# Patient Record
Sex: Male | Born: 1935 | Race: Black or African American | Hispanic: No | Marital: Married | Smoking: Former smoker
Health system: Southern US, Community
[De-identification: ages and names within clinical notes are randomized; demographics above are authoritative.]

## PROBLEM LIST (undated history)

## (undated) DIAGNOSIS — N400 Enlarged prostate without lower urinary tract symptoms: Secondary | ICD-10-CM

## (undated) DIAGNOSIS — R1310 Dysphagia, unspecified: Secondary | ICD-10-CM

## (undated) DIAGNOSIS — I10 Essential (primary) hypertension: Secondary | ICD-10-CM

## (undated) DIAGNOSIS — C189 Malignant neoplasm of colon, unspecified: Secondary | ICD-10-CM

## (undated) DIAGNOSIS — I2510 Atherosclerotic heart disease of native coronary artery without angina pectoris: Secondary | ICD-10-CM

## (undated) DIAGNOSIS — N200 Calculus of kidney: Secondary | ICD-10-CM

## (undated) DIAGNOSIS — I639 Cerebral infarction, unspecified: Secondary | ICD-10-CM

## (undated) DIAGNOSIS — J690 Pneumonitis due to inhalation of food and vomit: Secondary | ICD-10-CM

## (undated) HISTORY — PX: APPENDECTOMY: SHX54

## (undated) HISTORY — PX: CATARACT EXTRACTION: SUR2

---

## 2010-08-25 ENCOUNTER — Ambulatory Visit: Payer: Self-pay | Admitting: Internal Medicine

## 2010-12-31 ENCOUNTER — Ambulatory Visit: Payer: Self-pay | Admitting: Family Medicine

## 2013-12-09 ENCOUNTER — Inpatient Hospital Stay: Payer: Self-pay | Admitting: Internal Medicine

## 2013-12-09 LAB — BASIC METABOLIC PANEL
ANION GAP: 8 (ref 7–16)
BUN: 12 mg/dL (ref 7–18)
CO2: 23 mmol/L (ref 21–32)
Calcium, Total: 8.6 mg/dL (ref 8.5–10.1)
Chloride: 107 mmol/L (ref 98–107)
Creatinine: 1.18 mg/dL (ref 0.60–1.30)
EGFR (African American): >60
EGFR (Non-African Amer.): 56 — ABNORMAL LOW
GLUCOSE: 119 mg/dL — AB (ref 65–99)
Osmolality: 277 (ref 275–301)
Potassium: 3.9 mmol/L (ref 3.5–5.1)
Sodium: 138 mmol/L (ref 136–145)

## 2013-12-09 LAB — CBC
HCT: 44.8 % (ref 40.0–52.0)
HGB: 14.3 g/dL (ref 13.0–18.0)
MCH: 30.1 pg (ref 26.0–34.0)
MCHC: 32 g/dL (ref 32.0–36.0)
MCV: 94 fL (ref 80–100)
PLATELETS: 146 10*3/uL — AB (ref 150–440)
RBC: 4.77 10*6/uL (ref 4.40–5.90)
RDW: 15.2 % — ABNORMAL HIGH (ref 11.5–14.5)
WBC: 6.5 10*3/uL (ref 3.8–10.6)

## 2013-12-09 LAB — TROPONIN I: Troponin-I: 0.02 ng/mL

## 2013-12-10 LAB — CBC WITH DIFFERENTIAL/PLATELET
Basophil #: 0 10*3/uL (ref 0.0–0.1)
Basophil %: 0.4 %
EOS ABS: 0.1 10*3/uL (ref 0.0–0.7)
Eosinophil %: 1.3 %
HCT: 47.5 % (ref 40.0–52.0)
HGB: 15.3 g/dL (ref 13.0–18.0)
Lymphocyte #: 0.7 10*3/uL — ABNORMAL LOW (ref 1.0–3.6)
Lymphocyte %: 12.4 %
MCH: 30.4 pg (ref 26.0–34.0)
MCHC: 32.2 g/dL (ref 32.0–36.0)
MCV: 95 fL (ref 80–100)
Monocyte #: 0.5 x10 3/mm (ref 0.2–1.0)
Monocyte %: 8.2 %
NEUTROS PCT: 77.7 %
Neutrophil #: 4.7 10*3/uL (ref 1.4–6.5)
Platelet: 161 10*3/uL (ref 150–440)
RBC: 5.03 10*6/uL (ref 4.40–5.90)
RDW: 15.1 % — ABNORMAL HIGH (ref 11.5–14.5)
WBC: 6 10*3/uL (ref 3.8–10.6)

## 2013-12-10 LAB — BASIC METABOLIC PANEL
Anion Gap: 11 (ref 7–16)
BUN: 9 mg/dL (ref 7–18)
CHLORIDE: 103 mmol/L (ref 98–107)
Calcium, Total: 9.1 mg/dL (ref 8.5–10.1)
Co2: 24 mmol/L (ref 21–32)
Creatinine: 1.19 mg/dL (ref 0.60–1.30)
EGFR (Non-African Amer.): 55 — ABNORMAL LOW
GLUCOSE: 132 mg/dL — AB (ref 65–99)
Osmolality: 276 (ref 275–301)
Potassium: 3.6 mmol/L (ref 3.5–5.1)
Sodium: 138 mmol/L (ref 136–145)

## 2013-12-10 LAB — LIPID PANEL
Cholesterol: 187 mg/dL (ref 0–200)
HDL Cholesterol: 55 mg/dL (ref 40–60)
LDL CHOLESTEROL, CALC: 117 mg/dL — AB (ref 0–100)
Triglycerides: 75 mg/dL (ref 0–200)
VLDL Cholesterol, Calc: 15 mg/dL (ref 5–40)

## 2013-12-11 DIAGNOSIS — I359 Nonrheumatic aortic valve disorder, unspecified: Secondary | ICD-10-CM

## 2013-12-11 LAB — URINALYSIS, COMPLETE
Bilirubin,UR: NEGATIVE
Blood: NEGATIVE
Glucose,UR: NEGATIVE mg/dL (ref 0–75)
KETONE: NEGATIVE
NITRITE: NEGATIVE
Ph: 6 (ref 4.5–8.0)
Protein: NEGATIVE
RBC,UR: <1 /HPF (ref 0–5)
Specific Gravity: 1.01 (ref 1.003–1.030)
WBC UR: 43 /HPF (ref 0–5)

## 2014-01-24 DIAGNOSIS — R040 Epistaxis: Secondary | ICD-10-CM | POA: Insufficient documentation

## 2014-01-24 DIAGNOSIS — IMO0001 Reserved for inherently not codable concepts without codable children: Secondary | ICD-10-CM | POA: Insufficient documentation

## 2014-07-19 ENCOUNTER — Inpatient Hospital Stay: Payer: Self-pay | Admitting: Internal Medicine

## 2014-09-08 NOTE — Discharge Summary (Signed)
PATIENT NAME:  Phillip [name], Phillip [name] MR#:  [id number] DATE OF BIRTH:  20-Apr-1927  DATE OF ADMISSION:  12/09/2013 DATE OF DISCHARGE:  12/12/2013  DISCHARGE DIAGNOSES:  Hypertension due to medication noncompliance.   SECONDARY DIAGNOSES: 1.  Hypertension.  2.  Coronary artery disease. 3.  Cataract.   CONSULTATIONS: None.   PROCEDURES AND RADIOLOGY: CT scan of the head without contrast on July 25, showed no acute problems.   KUB on July 26, showed no acute pathology.   A 2-D echocardiogram on July 27, showed LVEF of 55% to 60%. Impaired relaxation of LV diastolic filling. Moderate concentric LVH.   MAJOR LABORATORY PANEL: Urinalysis on admission was negative.   HISTORY AND SHORT HOSPITAL COURSE: The patient is an 81 year old male with above-mentioned medical problems who was admitted for malignant hypertension. Please see Dr. Lacie Scotts dictated history and physical for further details. The patient was having severe headache, thought to be due to hypertensive urgency/malignant hypertension. He was started on IV antihypertensive nitroglycerin drip and was slowly improving. His blood pressure started getting much better. His symptoms improved by July 28, and was close to his baseline and was discharged home in stable condition.   PHYSICAL EXAMINATION: VITAL SIGNS: On the date of discharge, his vital signs are as follows: Temperature 98.1, heart rate 62 per minute, respirations 20 per minute, blood pressure 109/69 mmHg, was saturating 97% on room air.   CARDIOVASCULAR: S1, S2 normal. No murmurs, rubs or gallop.  LUNGS: Clear to auscultation bilaterally. No wheezing, rales, rhonchi, or crepitation.  ABDOMEN: Soft, benign.  NEUROLOGIC: Nonfocal examination.   All other physical examination remained at baseline.   DISCHARGE MEDICATIONS: 1.  Vitamin D3 1000 international units once daily. 2.  Hydrochlorothiazide  25 mg p.o. daily.  3.  Alfuzosin 10 mg p.o. at bedtime.  4.  Metoprolol 50  mg p.o. b.i.d.  5.  Clonidine 0.2 mg p.o. b.i.d.  6.  Isosorbide mononitrate 30 mg p.o. daily.   DISCHARGE DIET: Low sodium.   DISCHARGE ACTIVITY: As tolerated.   DISCHARGE INSTRUCTIONS AND FOLLOWUP: The patient was instructed to follow up with his primary care physician at [facility] in 1-2 weeks.  Please note, the patient was set up to get home health services by care management.  TOTAL TIME DISCHARGING THIS PATIENT: 45 minutes.    ____________________________ Lucina Mellow. Manuella Ghazi, MD vss:ds D: 12/13/2013 19:02:23 ET T: 12/13/2013 22:35:47 ET JOB#: [id number]  cc: Eathon Valade S. Manuella Ghazi, MD, <Dictator> [place] MD ELECTRONICALLY SIGNED 12/14/2013 19:50

## 2014-09-08 NOTE — H&P (Signed)
PATIENT NAME:  Phillip [name], NEBEL MR#:  [id number] DATE OF BIRTH:  November 24, 1926  DATE OF ADMISSION:  12/09/2013  PRIMARY CARE PHYSICIAN: [facility]   REFERRING PHYSICIAN: Dr. Jacqualine Code  CHIEF COMPLAINT: Headache.  HISTORY OF PRESENT ILLNESS: This 81 year old gentleman with past medical history of uncontrolled hypertension, presents today after two days of headache, and is found to have a blood pressure of greater than 200/100. He denies chest pain, shortness of breath, change in vision, focal numbness or weakness, swelling. He does have an occasional cough. He states that his headache began two days ago, though his wife is present and states that he has been feeling poorly for several days. His wife reports that he has a history of noncompliance with antihypertensive medications. His wife reports that he was in the emergency room at the [place] sometime this winter with a similar complaint of headache and was found to be very hypertensive at that time. He was not admitted at that time.   PAST MEDICAL HISTORY:  1.  Hypertension. 2.  Coronary artery disease, status post myocardial infarction in the 1970s. 3.  Nephrolithiasis. 4.  Cataracts bilaterally. 5.  History of small bowel obstructions, recurrent.   PAST SURGICAL HISTORY:  1.  Appendectomy. 2.  Small bowel obstruction procedure. 3.  Bilateral cataract removal. 4.  Procedure for nephrolithiasis.   ALLERGIES: No known allergies.  HOME MEDICATIONS:  1.  Metoprolol 50 mg daily. 2.  Amlodipine 10 mg daily.   SOCIAL HISTORY: The patient lives at home with his wife. He performs all of his own activities of daily living and is mobile without assistance. He is a former truck Aeronautical engineer. He is a [place] patient. He is not a smoker, does not drink alcohol.  FAMILY HISTORY: Positive for coronary artery disease and hypertension.  REVIEW OF SYSTEMS: CONSTITUTIONAL: Denies fever, weakness. Positive for fatigue. Negative for pain or weight  change. HEENT: Denies blurred or double vision, eye pain, glaucoma, change in hearing (he is very hard of hearing at baseline). Denies difficulty swallowing. Has had a recent dry throat with cough. RESPIRATORY: Denies wheezing, shortness of breath, painful respirations, cough with exudate. He has had a dry cough recently. CARDIOVASCULAR: Denies chest pain, orthopnea, decreased exercise tolerance, edema, palpitations. GASTROINTESTINAL: No nausea, vomiting, diarrhea, abdominal pain, hematochezia. No symptoms of reflux. GENITOURINARY: No dysuria, hematuria, frequency. MUSCULOSKELETAL: Denies pain, swelling, redness, change in range of motion of any joint. NEUROLOGIC: Denies focal numbness, weakness, any change in mental status. No seizures noted. PSYCHIATRIC: Denies anxiety, depression, insomnia.  PHYSICAL EXAMINATION: VITAL SIGNS: Temperature is 97.7, pulse is 84, respirations 16, saturation 100% on room air, blood pressure 197/107. GENERAL: The patient is fatigued, comfortable, oriented, lying in the exam bed. HEENT: Pupils are constricted, equal, reactive. Conjunctivae are clear. There is no icterus. Extraocular motion is intact. No nasal lesions or drainage. Mucous membranes are pink and moist. Poor dentition, with gingivitis and many missing teeth. NECK: Supple and symmetric. No JVD. Neck is nontender, with full range of motion. RESPIRATORY: Good respiratory effort. Lungs are clear to auscultation bilaterally. CARDIOVASCULAR: Regular rate and rhythm. No murmurs, rubs, or gallops. Pedal pulses are 2+. GASTROINTESTINAL: Bowel sounds are normal. There is no abdominal tenderness or distension. No rebound, guarding. MUSCULOSKELETAL: The patient moves all extremities. Range of motion is normal. no tenderness. No effusion. Strength and tone are equal bilaterally. SKIN: There is no rash, no ulceration, no wounds. LYMPH NODES: There is no cervical adenopathy. VASCULAR: Pedal pulses are 2+  bilaterally. NEUROLOGIC: Cranial nerves II through XII are grossly intact. Strength is normal and equal bilaterally. DTRs are symmetric. The patient is alert and oriented x4.  LABORATORY DATA: Sodium 138, potassium 3.9, chloride 107, bicarbonate 23, BUN 12, creatinine 1.18, glucose 119, calcium 8.6. Troponin is 0.02. White blood cell count is normal at 6.5, hemoglobin is 14.3, platelets are slightly low at 146, MCV is 94.  IMAGING: CT scan of the head shows age-related volume loss with periventricular small vessel disease; otherwise unremarkable.  ASSESSMENT AND PLAN:  1.  Hypertensive urgency. In the emergency room, the patient has received labetalol 20 mg pushes x2, as well as hydralazine 10 mg push x2, as well as 50 mg of oral metoprolol, with no noticeable change in blood pressure. He will be admitted to the Critical Care Unit on a nitroglycerin drip, to be titrated to goal blood pressure of 170/80. Once goal blood pressure is achieved, he can restart oral antihypertensives. It seems that he is noncompliant with his blood pressure regimen at home, which has led to very uncontrolled blood pressure. At this time, his only symptom is headache. Neurologic examination is normal, no shortness of breath, no chest pain. 2.  Coronary artery disease, status post myocardial infarction in the remote past in the 1970s. We will start aspirin 81 mg daily. Currently he is chest pain free. We will check serum lipids in the morning. 3.  Prophylaxis. We will use sequential compression devices for deep venous thrombosis prophylaxis at this time, due to very elevated blood pressure.  DISPOSITION: This is [facility] patient who has elected to stay here at [facility]. He is being admitted to the Coronary Care Unit as an inpatient for control of hypertensive urgency.  TIME SPENT DURING THIS ADMISSION: 45 minutes.     ____________________________ Earleen Newport. Volanda Napoleon, MD cpw:cg D: 12/09/2013 22:30:51 ET T: 12/09/2013 23:22:29  ET JOB#: [id number]  cc: Earleen Newport. Volanda Napoleon, MD, <Dictator> Aldean Jewett MD ELECTRONICALLY SIGNED 12/10/2013 14:04

## 2014-09-16 NOTE — Discharge Summary (Signed)
PATIENT NAME:  Phillip [name], Phillip [name] MR#:  [id number] DATE OF BIRTH:  [birth year]  DATE OF ADMISSION:  07/17/2014 DATE OF DISCHARGE:  07/20/2014  ADMITTING PHYSICIAN: Dustin Flock, MD  DISCHARGING PHYSICIAN: Gladstone Lighter, MD  PRIMARY CARE PHYSICIAN: In [place].  [place]: None.  DISCHARGE DIAGNOSES:  1.  Acute cerebrovascular accident.  2.  Possible sleep apnea sleep with sleep periods during daytime.  3.  Coronary artery disease.  4.  Uncontrolled hypertension.  5.  Benign prostatic hypertrophy.  DISCHARGE HOME MEDICATIONS:  1.  Vitamin D3 1,000 international units 1 capsule p.o. daily.  2.  Clonidine 0.2 mg p.o. b.i.d.  3.  Imdur 30 mg p.o. b.i.d.  4.  Alfuzosin 10 mg p.o. daily.  5.  Simvastatin 40 mg p.o. at bedtime.  6.  Aspirin 81 mg p.o. daily.  7.  Plavix 75 mg p.o. daily.  8.  Metoprolol 50 mg p.o. b.i.d.  9.  Hydralazine 50 mg p.o. 3 times a day.  10.  HCTZ 25 mg p.o. daily.   DISCHARGE DIET: Low-sodium diet.   DISCHARGE ACTIVITY: As tolerated.  FOLLOWUP INSTRUCTIONS: 1.  Home health services, physical therapy and nursing.  2.  PCP follow-up in 1 week.  3.  Will need sleep study as an outpatient.   LABORATORIES AND IMAGING STUDIES PRIOR TO DISCHARGE: Plasma ammonia within normal limits at 23. TSH 2.31.   Repeat CT head without contrast showing small lacunar infarct in right thalamus, mild periventricular small vessel disease. No acute ischemic infarct or changes noted.   Vitamin B12 is low at 154.   WBC 4.4, hemoglobin 12.3, hematocrit 38.1, platelet count 124,000.   Sodium 136, potassium 3.5, chloride 101, bicarb 27, BUN 11, creatinine 1.08, glucose 128, and calcium 9.3.   MRI of the brain without contrast showing tiny acute right thalamic infarct, mild chronic small vessel ischemic disease.  LDL cholesterol 94, HDL 42, total cholesterol 152, triglycerides 80.  Echo Doppler showing impaired relaxation pattern of LV diastolic filling,  normal LV ejection fraction with EF of 55% to 60%. Mild valvular changes noted. No evidence of any intracardiac shunting. No cardiac source of embolism identified.   Carotid Doppler showing no evidence of hemodynamically significant stenosis.   Blood cultures are negative.   INR is 1.1.   BRIEF HOSPITAL COURSE: Mr. Bralley is an 81 year old African American male with past medical history significant for coronary artery disease, hypertension and benign prostatic hypertrophy presented to the hospital secondary to sleepy, unresponsive episode.  1.  Acute CVA. Presented with unresponsive episode and also has left facial tingling and numbness. MRI showed acute right thalamic infarct. The patient does not have any other sensory or motor changes. Worked with physical therapy who recommended rehab. The patient is started on aspirin and Plavix and also statin.   2.  Uncontrolled hypertension. I am not sure if he is compliant with his medications at home. He is being discharged on clonidine, metoprolol, hydralazine, hydrochlorothiazide and Imdur at this time. Doses have been adjusted and he will need a PCP to follow up his blood pressure.  3.  Unresponsive episode. One of these episodes happened in the hospital where the patient sleeps for a few hours and then wakes up without any neurological deficits. He was also noted to have a couple of apneic episodes. He was tried with CPAP in the hospital which significantly improved his symptoms. He probably has sleep apnea and will need a sleep study as an outpatient.   His course  has been otherwise uneventful in the hospital.   DISCHARGE CONDITION: Stable.   DISCHARGE DISPOSITION: Home with home health.  TIME SPENT ON DISCHARGE: 45 minutes.  ____________________________ Gladstone Lighter, MD rk:sb D: 07/20/2014 12:31:28 ET T: 07/20/2014 14:23:56 ET JOB#: [id number]  cc: Gladstone Lighter, MD, <Dictator> Gladstone Lighter MD ELECTRONICALLY SIGNED 08/02/2014  18:09

## 2014-09-16 NOTE — H&P (Signed)
PATIENT NAME:  Phillip [name], Phillip [name] MR#:  [id number] DATE OF BIRTH:  05/30/1926  DATE OF ADMISSION:  07/17/2014  PRIMARY CARE PROVIDER:   MD in [place].   ER refering MD: Dr. Jimmye Norman  CHIEF COMPLAINT: Brief decrease in responsiveness, left-sided facial droop.   HISTORY OF PRESENT ILLNESS: The patient is an 81 year old African American male with history of coronary artery disease, hypertension, BPH, who was in his usual state of health, and watching TV earlier, when he had an episode where he basically became unresponsive, and had a left-sided facial droop. After an hour of this, his mental status normalized. The patient is currently back to baseline. He had a CT scan of the head, which was negative. To note, his blood pressure was elevated on arrival. The patient does have a history of having elevated blood pressure in the past when he was admitted previously. Otherwise, he reports that he is feeling fine. He is denying any extremity weakness. Denies any numbness or tingling. Denies any chest pain or shortness of breath.   PAST MEDICAL HISTORY: Significant for: 1.  Hypertension.  2.  Coronary artery disease, status MI in 1970s.  3.  Nephrolithiasis.  4.  Bilateral cataract.  5.  History of small bowel obstruction, recurrent.   PAST SURGICAL HISTORY: Status post appendectomy, small bowel obstruction procedure,  bilateral cataract removal. Procedure for nephrolithiasis.   ALLERGIES: None.   HOME MEDICATIONS: We are in the process of obtaining his home medications; currently not available.   SOCIAL HISTORY: Lives at home with his wife. The patient is recommended to use a walker and a cane, but he does not use any of that.   FAMILY HISTORY: Positive for coronary artery disease and hypertension.   REVIEW OF SYSTEMS:  CONSTITUTIONAL: Denies any fevers, weakness, fatigue, no pain. No weight changes. EYES: Denies any blurred or double vision, no eye pain, no glaucoma. Has a history of cataracts.   RESPIRATORY: Denies any cough, wheezing, hemoptysis.  No COPD. CARDIOVASCULAR: Denies any chest pain, orthopnea, edema. No palpitations.  GASTROINTESTINAL: No nausea, vomiting, diarrhea.  GENITOURINARY: Has a history of BPH.  Denies any urinary frequency, urgency, or hesitancy.  MUSCULOSKELETAL:  Denies any pain, swelling in his joints.  NEUROLOGICAL: Denies CVA, TIA, or seizure. PSYCHIATRIC: Denies anxiety, depression, insomnia.  LYMPHATICS: Denies any lymph node enlargements.   PHYSICAL EXAMINATION: VITAL SIGNS: Temperature 97.3, pulse 60, respirations 20, blood pressure 196/110.  GENERAL: The patient is a well-developed, well-nourished male in no acute distress.  HEENT: Head atraumatic, normocephalic. Pupils equally round, reactive to light and accommodation. There is no conjunctival pallor. No scleral icterus. Nasal exam shows no drainage or ulceration. Oropharynx is clear without any exudate. NECK: Supple without any thyromegaly.  CARDIOVASCULAR: Regular rate and rhythm. No murmurs, rubs, clicks, or gallops.  ABDOMEN: Soft, nontender, nondistended. Positive bowel sounds x 4. There is no hepatosplenomegaly.  EXTREMITIES: No clubbing, cyanosis, or edema.  SKIN: No rash.  LYMPH NODES: Nonpalpable.  MUSCULOSKELETAL: There is no erythema or swelling.  VASCULAR: Good DP, PT pulses.  EVALUATION: PA and lateral chest x-ray shows no edema or consolidation. INR 1.1. CT scan of the head shows no acute abnormality, chronic atrophy. Troponin 0.02. WBC 5.2, hemoglobin 13.1, platelet count 130,000. Glucose 117, BUN 14, creatinine 1.25, sodium 139, potassium 4.0, chloride 107, CO2 of 26.   ASSESSMENT AND PLAN: The patient is an 81 year old African American male with transient ischemic attack-like symptoms.  1.  Possible transient ischemic attack. We will check carotid Dopplers,  echocardiogram, start him on aspirin. Check a fasting lipid panel in the a.m.  2.  Accelerated hypertension, now symptoms  are resolved. We will do p.r.n. hydralazine, resume home medications once available.  3.  BPH. Obtain home medication list and resume.  4.  Miscellaneous. The patient will be on Lovenox for deep vein thrombosis prophylaxis   TIME SPENT ON THIS H AND P: 50 minutes.   ____________________________ Lafonda Mosses Posey Pronto, MD shp:LT D: 07/17/2014 17:43:24 ET T: 07/17/2014 18:28:15 ET JOB#: [id number]  cc: Rohn Fritsch H. Posey Pronto, MD, <Dictator> Alric Seton MD ELECTRONICALLY SIGNED 07/20/2014 15:26

## 2015-02-21 ENCOUNTER — Emergency Department: Payer: Medicare Other

## 2015-02-21 ENCOUNTER — Encounter: Payer: Self-pay | Admitting: Emergency Medicine

## 2015-02-21 ENCOUNTER — Inpatient Hospital Stay
Admission: EM | Admit: 2015-02-21 | Discharge: 2015-02-23 | DRG: 194 | Disposition: A | Discharge status: Home / Self Care | Payer: Medicare Other | Attending: Internal Medicine | Admitting: Internal Medicine

## 2015-02-21 DIAGNOSIS — I2510 Atherosclerotic heart disease of native coronary artery without angina pectoris: Secondary | ICD-10-CM | POA: Diagnosis present

## 2015-02-21 DIAGNOSIS — N400 Enlarged prostate without lower urinary tract symptoms: Secondary | ICD-10-CM | POA: Diagnosis present

## 2015-02-21 DIAGNOSIS — K529 Noninfective gastroenteritis and colitis, unspecified: Secondary | ICD-10-CM | POA: Diagnosis present

## 2015-02-21 DIAGNOSIS — Z8673 Personal history of transient ischemic attack (TIA), and cerebral infarction without residual deficits: Secondary | ICD-10-CM

## 2015-02-21 DIAGNOSIS — Z87442 Personal history of urinary calculi: Secondary | ICD-10-CM | POA: Diagnosis not present

## 2015-02-21 DIAGNOSIS — Z7902 Long term (current) use of antithrombotics/antiplatelets: Secondary | ICD-10-CM | POA: Diagnosis not present

## 2015-02-21 DIAGNOSIS — Z66 Do not resuscitate: Secondary | ICD-10-CM | POA: Diagnosis present

## 2015-02-21 DIAGNOSIS — E872 Acidosis: Secondary | ICD-10-CM | POA: Diagnosis present

## 2015-02-21 DIAGNOSIS — R404 Transient alteration of awareness: Secondary | ICD-10-CM | POA: Diagnosis present

## 2015-02-21 DIAGNOSIS — R1111 Vomiting without nausea: Secondary | ICD-10-CM

## 2015-02-21 DIAGNOSIS — Z955 Presence of coronary angioplasty implant and graft: Secondary | ICD-10-CM | POA: Diagnosis not present

## 2015-02-21 DIAGNOSIS — I10 Essential (primary) hypertension: Secondary | ICD-10-CM | POA: Diagnosis present

## 2015-02-21 DIAGNOSIS — R0602 Shortness of breath: Secondary | ICD-10-CM

## 2015-02-21 DIAGNOSIS — J189 Pneumonia, unspecified organism: Principal | ICD-10-CM | POA: Diagnosis present

## 2015-02-21 DIAGNOSIS — J690 Pneumonitis due to inhalation of food and vomit: Secondary | ICD-10-CM

## 2015-02-21 DIAGNOSIS — Z79899 Other long term (current) drug therapy: Secondary | ICD-10-CM | POA: Diagnosis not present

## 2015-02-21 DIAGNOSIS — K2970 Gastritis, unspecified, without bleeding: Secondary | ICD-10-CM | POA: Diagnosis present

## 2015-02-21 DIAGNOSIS — R112 Nausea with vomiting, unspecified: Secondary | ICD-10-CM | POA: Diagnosis present

## 2015-02-21 HISTORY — DX: Essential (primary) hypertension: I10

## 2015-02-21 HISTORY — DX: Dysphagia, unspecified: R13.10

## 2015-02-21 HISTORY — DX: Atherosclerotic heart disease of native coronary artery without angina pectoris: I25.10

## 2015-02-21 HISTORY — DX: Calculus of kidney: N20.0

## 2015-02-21 HISTORY — DX: Benign prostatic hyperplasia without lower urinary tract symptoms: N40.0

## 2015-02-21 LAB — COMPREHENSIVE METABOLIC PANEL
ALK PHOS: 56 U/L (ref 38–126)
ALT: 15 U/L — ABNORMAL LOW (ref 17–63)
ANION GAP: 7 (ref 5–15)
AST: 26 U/L (ref 15–41)
Albumin: 3.7 g/dL (ref 3.5–5.0)
BUN: 18 mg/dL (ref 6–20)
CALCIUM: 8.7 mg/dL — AB (ref 8.9–10.3)
CO2: 27 mmol/L (ref 22–32)
Chloride: 102 mmol/L (ref 101–111)
Creatinine, Ser: 1.17 mg/dL (ref 0.61–1.24)
GFR calc non Af Amer: 55 mL/min — ABNORMAL LOW (ref >60)
Glucose, Bld: 127 mg/dL — ABNORMAL HIGH (ref 65–99)
Potassium: 3.4 mmol/L — ABNORMAL LOW (ref 3.5–5.1)
SODIUM: 136 mmol/L (ref 135–145)
Total Bilirubin: 1.2 mg/dL (ref 0.3–1.2)
Total Protein: 6.3 g/dL — ABNORMAL LOW (ref 6.5–8.1)

## 2015-02-21 LAB — CBC WITH DIFFERENTIAL/PLATELET
BASOS PCT: 0 %
Basophils Absolute: 0 10*3/uL (ref 0–0.1)
EOS ABS: 0.1 10*3/uL (ref 0–0.7)
EOS PCT: 1 %
HEMATOCRIT: 38.8 % — AB (ref 40.0–52.0)
Hemoglobin: 12.6 g/dL — ABNORMAL LOW (ref 13.0–18.0)
Lymphocytes Relative: 6 %
Lymphs Abs: 0.4 10*3/uL — ABNORMAL LOW (ref 1.0–3.6)
MCH: 28.8 pg (ref 26.0–34.0)
MCHC: 32.4 g/dL (ref 32.0–36.0)
MCV: 88.9 fL (ref 80.0–100.0)
MONO ABS: 0.4 10*3/uL (ref 0.2–1.0)
MONOS PCT: 5 %
Neutro Abs: 6.4 10*3/uL (ref 1.4–6.5)
Neutrophils Relative %: 88 %
PLATELETS: 146 10*3/uL — AB (ref 150–440)
RBC: 4.37 MIL/uL — ABNORMAL LOW (ref 4.40–5.90)
RDW: 16.8 % — AB (ref 11.5–14.5)
WBC: 7.4 10*3/uL (ref 3.8–10.6)

## 2015-02-21 LAB — URINALYSIS COMPLETE WITH MICROSCOPIC (ARMC ONLY)
Bilirubin Urine: NEGATIVE
Glucose, UA: NEGATIVE mg/dL
HGB URINE DIPSTICK: NEGATIVE
KETONES UR: NEGATIVE mg/dL
Nitrite: NEGATIVE
PH: 6 (ref 5.0–8.0)
PROTEIN: NEGATIVE mg/dL
SPECIFIC GRAVITY, URINE: 1.015 (ref 1.005–1.030)

## 2015-02-21 LAB — LIPASE, BLOOD: LIPASE: 27 U/L (ref 22–51)

## 2015-02-21 LAB — LACTIC ACID, PLASMA
LACTIC ACID, VENOUS: 2.8 mmol/L — AB (ref 0.5–2.0)
Lactic Acid, Venous: 3 mmol/L (ref 0.5–2.0)

## 2015-02-21 LAB — TROPONIN I: Troponin I: <0.03 ng/mL (ref <0.031)

## 2015-02-21 MED ORDER — ENOXAPARIN SODIUM 40 MG/0.4ML ~~LOC~~ SOLN
40.0000 mg | SUBCUTANEOUS | Status: DC
Start: 2015-02-21 — End: 2015-02-23
  Administered 2015-02-21 – 2015-02-22 (×2): 40 mg via SUBCUTANEOUS
  Filled 2015-02-21 (×2): qty 0.4

## 2015-02-21 MED ORDER — ACETAMINOPHEN 325 MG PO TABS
325.0000 mg | ORAL_TABLET | Freq: Three times a day (TID) | ORAL | Status: DC
  Administered 2015-02-21 – 2015-02-22 (×2): 650 mg via ORAL
  Filled 2015-02-21 (×2): qty 2

## 2015-02-21 MED ORDER — AZITHROMYCIN 500 MG IV SOLR
250.0000 mg | INTRAVENOUS | Status: DC
  Administered 2015-02-22: 20:00:00 250 mg via INTRAVENOUS
  Filled 2015-02-21 (×2): qty 250

## 2015-02-21 MED ORDER — INFLUENZA VAC SPLIT QUAD 0.5 ML IM SUSY
0.5000 mL | PREFILLED_SYRINGE | INTRAMUSCULAR | Status: AC
  Administered 2015-02-22: 0.5 mL via INTRAMUSCULAR
  Filled 2015-02-21: qty 0.5

## 2015-02-21 MED ORDER — ALFUZOSIN HCL ER 10 MG PO TB24
10.0000 mg | ORAL_TABLET | Freq: Every day | ORAL | Status: DC
  Administered 2015-02-21 – 2015-02-22 (×2): 10 mg via ORAL
  Filled 2015-02-21 (×3): qty 1

## 2015-02-21 MED ORDER — IOHEXOL 240 MG/ML SOLN
25.0000 mL | Freq: Once | INTRAMUSCULAR | Status: AC | PRN
  Administered 2015-02-21: 25 mL via INTRAVENOUS

## 2015-02-21 MED ORDER — SIMVASTATIN 40 MG PO TABS
40.0000 mg | ORAL_TABLET | Freq: Every day | ORAL | Status: DC
  Administered 2015-02-21 – 2015-02-22 (×2): 40 mg via ORAL
  Filled 2015-02-21 (×2): qty 1

## 2015-02-21 MED ORDER — DEXTROSE 5 % IV SOLN
500.0000 mg | Freq: Once | INTRAVENOUS | Status: AC
  Administered 2015-02-21: 500 mg via INTRAVENOUS
  Filled 2015-02-21: qty 500

## 2015-02-21 MED ORDER — DEXTROSE 5 % IV SOLN
1.0000 g | Freq: Once | INTRAVENOUS | Status: AC
  Administered 2015-02-21: 1 g via INTRAVENOUS
  Filled 2015-02-21: qty 10

## 2015-02-21 MED ORDER — HYDRALAZINE HCL 50 MG PO TABS
50.0000 mg | ORAL_TABLET | Freq: Three times a day (TID) | ORAL | Status: DC
  Administered 2015-02-21 – 2015-02-23 (×4): 50 mg via ORAL
  Filled 2015-02-21: qty 1
  Filled 2015-02-21: qty 2
  Filled 2015-02-21 (×2): qty 1

## 2015-02-21 MED ORDER — METOPROLOL TARTRATE 50 MG PO TABS
50.0000 mg | ORAL_TABLET | Freq: Two times a day (BID) | ORAL | Status: DC
  Administered 2015-02-21 – 2015-02-23 (×4): 50 mg via ORAL
  Filled 2015-02-21 (×4): qty 1

## 2015-02-21 MED ORDER — IOHEXOL 300 MG/ML  SOLN
100.0000 mL | Freq: Once | INTRAMUSCULAR | Status: AC | PRN
  Administered 2015-02-21: 100 mL via INTRAVENOUS

## 2015-02-21 MED ORDER — HYDRALAZINE HCL 20 MG/ML IJ SOLN
INTRAMUSCULAR | Status: AC
  Administered 2015-02-21: 10 mg via INTRAVENOUS
  Filled 2015-02-21: qty 1

## 2015-02-21 MED ORDER — HYDRALAZINE HCL 20 MG/ML IJ SOLN
10.0000 mg | Freq: Four times a day (QID) | INTRAMUSCULAR | Status: DC | PRN
  Administered 2015-02-21: 10 mg via INTRAVENOUS

## 2015-02-21 MED ORDER — VITAMIN D 1000 UNITS PO TABS
1000.0000 [IU] | ORAL_TABLET | Freq: Two times a day (BID) | ORAL | Status: DC
  Administered 2015-02-21 – 2015-02-23 (×4): 1000 [IU] via ORAL
  Filled 2015-02-21 (×4): qty 1

## 2015-02-21 MED ORDER — SODIUM CHLORIDE 0.9 % IV BOLUS (SEPSIS)
500.0000 mL | Freq: Once | INTRAVENOUS | Status: AC
  Administered 2015-02-21: 500 mL via INTRAVENOUS

## 2015-02-21 MED ORDER — ISOSORBIDE MONONITRATE ER 30 MG PO TB24
30.0000 mg | ORAL_TABLET | Freq: Every day | ORAL | Status: DC
  Administered 2015-02-21 – 2015-02-23 (×3): 30 mg via ORAL
  Filled 2015-02-21 (×4): qty 1

## 2015-02-21 MED ORDER — CLONIDINE HCL 0.1 MG PO TABS
0.2000 mg | ORAL_TABLET | Freq: Two times a day (BID) | ORAL | Status: DC
  Administered 2015-02-21 – 2015-02-23 (×4): 0.2 mg via ORAL
  Filled 2015-02-21 (×4): qty 2

## 2015-02-21 MED ORDER — DEXTROSE 5 % IV SOLN: 1.0000 g | INTRAVENOUS | Status: DC

## 2015-02-21 MED ORDER — DEXTROSE 5 % IV SOLN
1.0000 g | INTRAVENOUS | Status: DC
  Administered 2015-02-22: 17:00:00 1 g via INTRAVENOUS
  Filled 2015-02-21 (×2): qty 10

## 2015-02-21 MED ORDER — CLOPIDOGREL BISULFATE 75 MG PO TABS
75.0000 mg | ORAL_TABLET | Freq: Every day | ORAL | Status: DC
  Administered 2015-02-21 – 2015-02-23 (×3): 75 mg via ORAL
  Filled 2015-02-21 (×3): qty 1

## 2015-02-21 NOTE — H&P (Signed)
[facility] [place] NAME: Phillip [name]    MR#:  [id number]  DATE OF BIRTH:  Apr 09, 1927  DATE OF ADMISSION:  02/21/2015  PRIMARY CARE PHYSICIAN: No primary care provider on file.   REQUESTING/REFERRING PHYSICIAN: Conni Slipper  CHIEF COMPLAINT:   Chief Complaint  Patient presents with  . Emesis    HISTORY OF PRESENT ILLNESS: Phillip [name]  is a 81 y.o. male with a known history of hypertension, kidney stone, coronary artery disease lives with his wife and independent in his day-to-day activities, requires to use a can or walker sometimes. As per daughter for last 3-4 days he appears little bit weaker than his normal self, and today morning he started to have nausea or vomiting and abdominal pain concerned with this he was brought to emergency room. In ER initial workup showed his lactic acid was higher but x-ray abdomen was negative so CT scan of abdomen and chest was done and it showed infiltrate in the lower lobes of the lungs so he is given to hospitalist team for further management of his pneumonia. He agrees that he had some chills but denies any fever or sputum production.  PAST MEDICAL HISTORY:   Past Medical History  Diagnosis Date  . Hypertension   . Benign enlargement of prostate   . Kidney stone   . Coronary artery disease   . Dysphagia     PAST SURGICAL HISTORY:  Past Surgical History  Procedure Laterality Date  . Appendectomy    . Cataract extraction      SOCIAL HISTORY:  Social History  Substance Use Topics  . Smoking status: Former Research scientist (life sciences)  . Smokeless tobacco: Not on file  . Alcohol Use: No    FAMILY HISTORY:  Family History  Problem Relation Age of Onset  . Lung cancer Father     DRUG ALLERGIES: Not on File  REVIEW OF SYSTEMS:   CONSTITUTIONAL: No fever,positive fatigue or weakness.  EYES: No blurred or double vision.  EARS, NOSE, AND THROAT: No tinnitus or ear pain.  RESPIRATORY: No  cough, shortness of breath, wheezing or hemoptysis.  CARDIOVASCULAR: No chest pain, orthopnea, edema.  GASTROINTESTINAL: positive for nausea, vomiting,  No diarrhea or abdominal pain.  GENITOURINARY: No dysuria, hematuria.  ENDOCRINE: No polyuria, nocturia,  HEMATOLOGY: No anemia, easy bruising or bleeding SKIN: No rash or lesion. MUSCULOSKELETAL: No joint pain or arthritis.   NEUROLOGIC: No tingling, numbness, weakness.  PSYCHIATRY: No anxiety or depression.   MEDICATIONS AT HOME:  Prior to Admission medications   Medication Sig Start Date End Date Taking? Authorizing Provider  acetaminophen (TYLENOL) 325 MG tablet Take 325-650 mg by mouth 3 (three) times daily.   Yes Historical Provider, MD  alfuzosin (UROXATRAL) 10 MG 24 hr tablet Take 10 mg by mouth at bedtime.   Yes Historical Provider, MD  cholecalciferol (VITAMIN D) 1000 UNITS tablet Take 1,000 Units by mouth 2 (two) times daily.   Yes Historical Provider, MD  cloNIDine (CATAPRES) 0.2 MG tablet Take 0.2 mg by mouth 2 (two) times daily.   Yes Historical Provider, MD  clopidogrel (PLAVIX) 75 MG tablet Take 75 mg by mouth daily.   Yes Historical Provider, MD  hydrALAZINE (APRESOLINE) 50 MG tablet Take 50 mg by mouth 3 (three) times daily.   Yes Historical Provider, MD  hydrochlorothiazide (HYDRODIURIL) 25 MG tablet Take 25 mg by mouth daily.   Yes Historical Provider, MD  isosorbide dinitrate (ISORDIL) 30 MG tablet Take  30 mg by mouth daily.   Yes Historical Provider, MD  metoprolol (LOPRESSOR) 50 MG tablet Take 50 mg by mouth 2 (two) times daily.   Yes Historical Provider, MD  Saline 0.9 % AERS Place 1 spray into the nose 4 (four) times daily.   Yes Historical Provider, MD  simvastatin (ZOCOR) 40 MG tablet Take 40 mg by mouth at bedtime.   Yes Historical Provider, MD      PHYSICAL EXAMINATION:   VITAL SIGNS: Blood pressure 160/74, pulse 57, temperature 97.2 F (36.2 C), temperature source Oral, resp. rate 16, height 6\' 1"  (1.854  m), weight 99.791 kg (220 lb), SpO2 96 %.  GENERAL:  81 y.o.-year-old patient lying in the bed with no acute distress.  EYES: Pupils equal, round, reactive to light and accommodation. No scleral icterus. Extraocular muscles intact.  HEENT: Head atraumatic, normocephalic. Oropharynx and nasopharynx clear.  NECK:  Supple, no jugular venous distention. No thyroid enlargement, no tenderness.  LUNGS: Normal breath sounds bilaterally, no wheezing, mild lower lobe crepitation. No use of accessory muscles of respiration.  CARDIOVASCULAR: S1, S2 normal. No murmurs, rubs, or gallops.  ABDOMEN: Soft, nontender, nondistended. Bowel sounds present. No organomegaly or mass.  EXTREMITIES: No pedal edema, cyanosis, or clubbing.  NEUROLOGIC: Cranial nerves II through XII are intact. Muscle strength 5/5 in all extremities. Sensation intact. Gait not checked.  PSYCHIATRIC: The patient is alert and oriented x 3.  SKIN: No obvious rash, lesion, or ulcer.   LABORATORY PANEL:   CBC  Recent Labs Lab 02/21/15 1317  WBC 7.4  HGB 12.6*  HCT 38.8*  PLT 146*  MCV 88.9  MCH 28.8  MCHC 32.4  RDW 16.8*  LYMPHSABS 0.4*  MONOABS 0.4  EOSABS 0.1  BASOSABS 0.0   ------------------------------------------------------------------------------------------------------------------  Chemistries   Recent Labs Lab 02/21/15 1317  NA 136  K 3.4*  CL 102  CO2 27  GLUCOSE 127*  BUN 18  CREATININE 1.17  CALCIUM 8.7*  AST 26  ALT 15*  ALKPHOS 56  BILITOT 1.2   ------------------------------------------------------------------------------------------------------------------ estimated creatinine clearance is 56.3 mL/min (by C-G formula based on Cr of 1.17). ------------------------------------------------------------------------------------------------------------------ No results for input(s): TSH, T4TOTAL, T3FREE, THYROIDAB in the last 72 hours.  Invalid input(s): FREET3   Coagulation profile No results  for input(s): INR, PROTIME in the last 168 hours. ------------------------------------------------------------------------------------------------------------------- No results for input(s): DDIMER in the last 72 hours. -------------------------------------------------------------------------------------------------------------------  Cardiac Enzymes  Recent Labs Lab 02/21/15 1317  TROPONINI <0.03   ------------------------------------------------------------------------------------------------------------------ Invalid input(s): POCBNP  ---------------------------------------------------------------------------------------------------------------  Urinalysis    Component Value Date/Time   COLORURINE YELLOW* 02/21/2015 1317   COLORURINE Yellow 12/11/2013 1501   APPEARANCEUR HAZY* 02/21/2015 1317   APPEARANCEUR Hazy 12/11/2013 1501   LABSPEC 1.015 02/21/2015 1317   LABSPEC 1.010 12/11/2013 1501   PHURINE 6.0 02/21/2015 1317   PHURINE 6.0 12/11/2013 1501   GLUCOSEU NEGATIVE 02/21/2015 1317   GLUCOSEU Negative 12/11/2013 1501   HGBUR NEGATIVE 02/21/2015 1317   HGBUR Negative 12/11/2013 1501   BILIRUBINUR NEGATIVE 02/21/2015 1317   BILIRUBINUR Negative 12/11/2013 1501   KETONESUR NEGATIVE 02/21/2015 1317   KETONESUR Negative 12/11/2013 1501   PROTEINUR NEGATIVE 02/21/2015 1317   PROTEINUR Negative 12/11/2013 1501   NITRITE NEGATIVE 02/21/2015 1317   NITRITE Negative 12/11/2013 1501   LEUKOCYTESUR TRACE* 02/21/2015 1317   LEUKOCYTESUR 2+ 12/11/2013 1501     RADIOLOGY: Ct Head Wo Contrast  02/21/2015   CLINICAL DATA:  Acute onset lethargy and vomiting  EXAM: CT HEAD WITHOUT CONTRAST  TECHNIQUE: Contiguous axial images were obtained from the base of the skull through the vertex without intravenous contrast.  COMPARISON:  July 19, 2014  FINDINGS: There is age related volume loss. There is no intracranial mass, hemorrhage, extra-axial fluid collection, or midline shift. There is  a focal area of decreased attenuation in the anterior left corona radiata, not present on prior study. There are 2 foci of decreased attenuation in the right anterior corona radiata, not present previously. There is mild periventricular small vessel disease in the centra semiovale, stable. Elsewhere gray-white compartments appear unremarkable. Bony calvarium appears intact. The visualized mastoid air cells are clear.  IMPRESSION: Small superior white matter infarcts which were not present 7 months prior but do not appear acute. There is mild periventricular small vessel disease. There is age related volume loss. There is no hemorrhage or mass effect.   Electronically Signed   By: Lowella Grip III M.D.   On: 02/21/2015 13:54   Ct Abdomen Pelvis W Contrast  02/21/2015   CLINICAL DATA:  Projectile vomiting with dizziness abdominal pain for 2 days.  EXAM: CT ABDOMEN AND PELVIS WITH CONTRAST  TECHNIQUE: Multidetector CT imaging of the abdomen and pelvis was performed using the standard protocol following bolus administration of intravenous contrast.  CONTRAST:  155mL OMNIPAQUE IOHEXOL 300 MG/ML  SOLN  COMPARISON:  None.  FINDINGS: There are numerous hepatic cysts. Pancreas, spleen, and adrenal glands appear normal. Small hypodense lesions within the left kidney are too small to definitively characterize but probably cysts. Right kidney is unremarkable. No renal stone or hydronephrosis bilaterally. No ureteral or bladder calculi identified. There is a left- sided bladder wall diverticulum. Prostate is enlarged causing mass effect on the bladder base.  Gallbladder is moderately distended but otherwise unremarkable. No pericholecystic inflammation. No adjacent common bile duct or intrahepatic bile duct dilatation.  Stomach is moderately distended but otherwise unremarkable. No gastric wall thickening or inflammation seen. Small and large bowel are normal in caliber throughout. Scattered diverticulosis noted within  the descending colon but no inflammatory change to suggest acute diverticulitis. No bowel wall thickening or evidence of bowel wall inflammation seen.  No free fluid or abscess collection. No free intraperitoneal air. No significant lymphadenopathy. Appendix is not seen but there are no inflammatory changes about the cecum to suggest acute appendicitis.  Atherosclerotic changes noted along the walls of the normal- caliber abdominal aorta. Patchy consolidations are present at both lung bases, left greater than right, which could be atelectasis or pneumonia. Degenerative changes are seen throughout the thoracolumbar spine but no acute osseous abnormality.  IMPRESSION: 1. Patchy consolidations at each lung base, left greater than right, suspicious for pneumonia or aspiration pneumonitis. Atelectasis is a possibility for the right lung base findings. Atelectasis is less likely at the left lung base. 2. Colonic diverticulosis without evidence of acute diverticulitis. 3. Gallbladder is moderately distended but otherwise unremarkable. There is questionable gallbladder wall thickening but no pericholecystic fluid or inflammation to suggest an acute cholecystitis. No bile duct dilatation. 4. Prominent prostate gland enlargement causing mass effect on the bladder base and is presumably the cause of a left-sided bladder wall diverticulum. Bladder is otherwise unremarkable. No bladder distention. No bladder stone. 5. Numerous hepatic cysts.  Probable small left renal cysts. 6. Remainder of the abdomen and pelvis is unremarkable. Overall, no evidence of acute intra-abdominal or intrapelvic abnormality. No free fluid. No bowel obstruction or evidence of bowel wall inflammation. No evidence of acute solid organ abnormality.   Electronically  Signed   By: Franki Cabot M.D.   On: 02/21/2015 17:03   Dg Abd Acute W/chest  02/21/2015   CLINICAL DATA:  Vomiting for 2 days, dizziness and weakness  EXAM: DG ABDOMEN ACUTE W/ 1V CHEST   COMPARISON:  Chest x-ray dated 07/17/2014.  FINDINGS: Cardiomediastinal silhouette is stable in size and configuration. The heart size is normal. Likely some degree of age-related aortic ectasia. Lungs remain clear. No pleural effusion seen. No pneumothorax.  There are no dilated large or small bowel loops identified. Scattered nonspecific air-fluid levels are seen within the left abdomen and upper pelvis which can be an indication of underlying gastroenteritis. No evidence of free intraperitoneal air. No evidence of soft tissue mass. No pathologic-appearing calcifications seen.  Degenerative changes are seen throughout the thoracolumbar spine and at each hip joint. Osseous structures are diffusely osteopenic. No acute osseous abnormality identified.  IMPRESSION: 1. Lungs are clear and there is no evidence of acute cardiopulmonary abnormality. 2. Scattered nonspecific air-fluid levels within the left abdomen and upper pelvis which can be an indication of underlying gastroenteritis. Additional air-fluid level noted within the stomach. 3. No convincing evidence of a bowel obstruction. No dilated large or small bowel loops seen.   Electronically Signed   By: Franki Cabot M.D.   On: 02/21/2015 14:08    IMPRESSION AND PLAN:  * Pneumonia  We'll treat with Rocephin and azithromycin.  Blood cultures ordered by ER.  * Lactic acidosis  His blood pressure is stable, CT scan of the abdomen and chest is done which showed only pneumonia no acute findings other than that. Continue monitoring.  * Hypertension  Continue clonidine and hydralazine will hold hydrochlorothiazide with presence of infection and lactic acidosis.  * History of coronary artery disease and stent placement  Continue cardiac medications.  All the records are reviewed and case discussed with ED provider. Management plans discussed with the patient, family and they are in agreement.  CODE STATUS: DO NOT RESUSCITATE Patient's wife and  daughter and sister-in-law are present in the room, I discuss in presence of them about his condition plans and his future wishes for resuscitation and healthcare power of attorney. they understood and 80 with the plan.   TOTAL TIME TAKING CARE OF THIS PATIENT: 50 minutes.    Vaughan Basta M.D on 02/21/2015   Between 7am to 6pm - Pager - [phone]  After 6pm go to [email or web address] - password [email or web address] [facility]  [facility]  Office  [phone]  CC: Primary care physician; No primary care provider on file.   Note: This dictation was prepared with Dragon dictation along with smaller phrase technology. Any transcriptional errors that result from this process are unintentional.

## 2015-02-21 NOTE — ED Notes (Signed)
Awake, Alert, and oriented x 3. Skin warm and dry.  NAD.

## 2015-02-21 NOTE — ED Notes (Signed)
Arrives from home via EMS for complaint of vomiting x 2 today, dizziness and abdominal pain.

## 2015-02-21 NOTE — ED Provider Notes (Addendum)
[facility] Emergency Department Provider Note  ____________________________________________   I have reviewed the triage vital signs and the nursing notes.   HISTORY  Chief Complaint Emesis    HPI Phillip [name]. is a 81 y.o. male with a history of hypertension, kidney stones, BPH, coronary disease, and small bowel obstructions in the past presents today withvomiting. I talked to him and his family. He did not pass out or have a headache, however, he was somewhat more weak than normal to his family. He does have a history of TIAs in the past and SBO's.Marland Kitchen Upon arrival, the patient has no complaints except for some residual abdominal pain after vomiting. He was apparently in his normal state of health until he started throwing up a few hours before he got here. It was nonbloody nonbilious. He's had no hematemesis. He had no testicular pain or swelling. He denies any focal numbness or weakness. Denies headache. Denies shortness of breath. He denies chest pain. He does have some mostly mid to lower abdominal discomfort however.  Past Medical History  Diagnosis Date  . Hypertension   . Benign enlargement of prostate   . Kidney stone   . Coronary artery disease   . Dysphagia     There are no active problems to display for this patient.   No past surgical history on file.  Current Outpatient Rx  Name  Route  Sig  Dispense  Refill  . acetaminophen (TYLENOL) 325 MG tablet   Oral   Take 325-650 mg by mouth 3 (three) times daily.         Marland Kitchen alfuzosin (UROXATRAL) 10 MG 24 hr tablet   Oral   Take 10 mg by mouth at bedtime.         . cholecalciferol (VITAMIN D) 1000 UNITS tablet   Oral   Take 1,000 Units by mouth 2 (two) times daily.         . cloNIDine (CATAPRES) 0.2 MG tablet   Oral   Take 0.2 mg by mouth 2 (two) times daily.         . clopidogrel (PLAVIX) 75 MG tablet   Oral   Take 75 mg by mouth daily.         . hydrALAZINE  (APRESOLINE) 50 MG tablet   Oral   Take 50 mg by mouth 3 (three) times daily.         . hydrochlorothiazide (HYDRODIURIL) 25 MG tablet   Oral   Take 25 mg by mouth daily.         . isosorbide dinitrate (ISORDIL) 30 MG tablet   Oral   Take 30 mg by mouth daily.         . metoprolol (LOPRESSOR) 50 MG tablet   Oral   Take 50 mg by mouth 2 (two) times daily.         . Saline 0.9 % AERS   Nasal   Place 1 spray into the nose 4 (four) times daily.         . simvastatin (ZOCOR) 40 MG tablet   Oral   Take 40 mg by mouth at bedtime.           Allergies Review of patient's allergies indicates not on file.  No family history on file.  Social History Social History  Substance Use Topics  . Smoking status: Never Smoker   . Smokeless tobacco: Not on file  . Alcohol Use: No    Review of Systems  Constitutional: No fever/chills Eyes: No visual changes. ENT: No sore throat. No stiff neck no neck pain Cardiovascular: Denies chest pain. Respiratory: Denies shortness of breath. Gastrointestinal:   Positive vomiting.  No diarrhea.  No constipation. Genitourinary: Negative for dysuria. Musculoskeletal: Negative lower extremity swelling Skin: Negative for rash. Neurological: Negative for headaches, focal weakness or numbness. 10-point ROS otherwise negative.  ____________________________________________   PHYSICAL EXAM:  VITAL SIGNS: ED Triage Vitals  Enc Vitals Group     BP 02/21/15 1339 160/74 mmHg     Pulse Rate 02/21/15 1339 57     Resp 02/21/15 1339 16     Temp 02/21/15 1339 97.2 F (36.2 C)     Temp Source 02/21/15 1339 Oral     SpO2 02/21/15 1339 96 %     Weight 02/21/15 1339 220 lb (99.791 kg)     Height 02/21/15 1339 6\' 1"  (1.854 m)     Head Cir --      Peak Flow --      Pain Score 02/21/15 1340 4     Pain Loc --      Pain Edu? --      Excl. in [place]? --     Constitutional: Alert and oriented little hard of hearing, thought it was 2015 but knew it  was October. Well appearing and in no acute distress. Eyes: Conjunctivae are normal. PERRL. EOMI. Head: Atraumatic. Nose: No congestion/rhinnorhea. Mouth/Throat: Mucous membranes are moist.  Oropharynx non-erythematous. Neck: No stridor.   Nontender with no meningismus Cardiovascular: Normal rate, regular rhythm. Grossly normal heart sounds.  Good peripheral circulation. Respiratory: Normal respiratory effort.  No retractions. Lungs CTAB. Gastrointestinal: Soft mild tenderness noted to lower left abdomen do not palpate pulsatile masses No distention. No guarding no rebound Back:  There is no focal tenderness or step off there is no midline tenderness there are no lesions noted. there is no CVA tenderness Genitourinary: Normal external male genitalia with no evidence of infection mass or tenderness Musculoskeletal: No lower extremity tenderness. No joint effusions, no DVT signs strong distal pulses no edema Neurologic:  Normal speech and language. No gross focal neurologic deficits are appreciated.  Skin:  Skin is warm, dry and intact. No rash noted. Psychiatric: Mood and affect are normal. Speech and behavior are normal.  ____________________________________________   LABS (all labs ordered are listed, but only abnormal results are displayed)  Labs Reviewed  CBC WITH DIFFERENTIAL/PLATELET - Abnormal; Notable for the following:    RBC 4.37 (*)    Hemoglobin 12.6 (*)    HCT 38.8 (*)    RDW 16.8 (*)    Platelets 146 (*)    Lymphs Abs 0.4 (*)    All other components within normal limits  COMPREHENSIVE METABOLIC PANEL - Abnormal; Notable for the following:    Potassium 3.4 (*)    Glucose, Bld 127 (*)    Calcium 8.7 (*)    Total Protein 6.3 (*)    ALT 15 (*)    GFR calc non Af Amer 55 (*)    All other components within normal limits  LACTIC ACID, PLASMA - Abnormal; Notable for the following:    Lactic Acid, Venous 3.0 (*)    All other components within normal limits  URINE CULTURE   TROPONIN I  LIPASE, BLOOD  URINALYSIS COMPLETEWITH MICROSCOPIC ([facility] ONLY)  LACTIC ACID, PLASMA   ____________________________________________  EKG Normal sinus rhythm rate 64 bpm, no acute ST elevation or depression, PACs noted, there is repolarization abnormality, normal  axis I personally reviewed this EKG  ____________________________________________  RADIOLOGY  I personally reviewed x-rays ____________________________________________   PROCEDURES  Procedure(s) performed: None  Critical Care performed: None  ____________________________________________   INITIAL IMPRESSION / ASSESSMENT AND PLAN / ED COURSE  Pertinent labs & imaging results that were available during my care of the patient were reviewed by me and considered in my medical decision making (see chart for details).  Multiple different pacing causative of this patient's age to have vomiting and feel slightly depressed in his mentation however at this time he is at his baseline according to family. He is neurologically intact and has been so. There is no evidence of CVA acutely. I did do a CT of his head there is no evidence of bleed. In addition, patient has no evidence of SBO that I can see on x-ray however given that he has abdominal pain and vomiting and his age we will obtain a CT scan. There is a mildly elevated lactic acid which certainly is of concern but that is a very nonspecific finding. We are giving him ginger IV fluids given his age, we are waiting the results of the CT scan. Blood work is large and reassuring and the patient has not asked for any pain medication does not wish any has a mild discomfort in his abdomen and he denies any current nausea symptoms. We are waiting CT prior to by mouth challenge and will reassess. ____________________________________________  ----------------------------------------- 5:11 PM on 02/21/2015 -----------------------------------------  CT scan does not show any  acute injury or pathology however, patient is noted to have what is apparently an aspirational consolidation on his lungs. We will obtain cultures and give IV antibiotics she will be admitted for further observation. This is likely a pneumonitis at this point not a frank pneumonia but he does need to be watched  FINAL CLINICAL IMPRESSION(S) / ED DIAGNOSES  Final diagnoses:  None      Schuyler Amor, MD 02/21/15 [place], MD 02/21/15 [phone]

## 2015-02-22 ENCOUNTER — Encounter: Payer: Self-pay | Admitting: *Deleted

## 2015-02-22 DIAGNOSIS — J189 Pneumonia, unspecified organism: Secondary | ICD-10-CM | POA: Diagnosis not present

## 2015-02-22 LAB — CBC
HCT: 32.1 % — ABNORMAL LOW (ref 40.0–52.0)
HEMOGLOBIN: 10.7 g/dL — AB (ref 13.0–18.0)
MCH: 29.3 pg (ref 26.0–34.0)
MCHC: 33.5 g/dL (ref 32.0–36.0)
MCV: 87.7 fL (ref 80.0–100.0)
Platelets: 133 10*3/uL — ABNORMAL LOW (ref 150–440)
RBC: 3.66 MIL/uL — ABNORMAL LOW (ref 4.40–5.90)
RDW: 17 % — ABNORMAL HIGH (ref 11.5–14.5)
WBC: 8.6 10*3/uL (ref 3.8–10.6)

## 2015-02-22 LAB — BASIC METABOLIC PANEL
ANION GAP: 5 (ref 5–15)
BUN: 19 mg/dL (ref 6–20)
CALCIUM: 8.4 mg/dL — AB (ref 8.9–10.3)
CO2: 27 mmol/L (ref 22–32)
Chloride: 103 mmol/L (ref 101–111)
Creatinine, Ser: 1.33 mg/dL — ABNORMAL HIGH (ref 0.61–1.24)
GFR, EST AFRICAN AMERICAN: 54 mL/min — AB (ref >60)
GFR, EST NON AFRICAN AMERICAN: 47 mL/min — AB (ref >60)
GLUCOSE: 128 mg/dL — AB (ref 65–99)
POTASSIUM: 3.5 mmol/L (ref 3.5–5.1)
SODIUM: 135 mmol/L (ref 135–145)

## 2015-02-22 MED ORDER — ENSURE ENLIVE PO LIQD: 237.0000 mL | Freq: Two times a day (BID) | ORAL | Status: DC

## 2015-02-22 MED ORDER — ACETAMINOPHEN 325 MG PO TABS: 325.0000 mg | ORAL_TABLET | Freq: Three times a day (TID) | ORAL | Status: DC | PRN

## 2015-02-22 NOTE — Evaluation (Signed)
Clinical/Bedside Swallow Evaluation Patient Details  Name: Phillip [name]. MRN: [id number] Date of Birth: 03/05/1927  Today's Date: 02/22/2015 Time: SLP Start Time (ACUTE ONLY): 1105 SLP Stop Time (ACUTE ONLY): 1205 SLP Time Calculation (min) (ACUTE ONLY): 60 min  Past Medical History:  Past Medical History  Diagnosis Date  . Hypertension   . Benign enlargement of prostate   . Kidney stone   . Coronary artery disease   . Dysphagia    Past Surgical History:  Past Surgical History  Procedure Laterality Date  . Appendectomy    . Cataract extraction     HPI:  Pt is a 81 y.o. male with a known history of hypertension, kidney stone, coronary artery disease lives with his wife and independent in his day-to-day activities, requires to use a can or walker sometimes. As per daughter for last 3-4 days he appears little bit weaker than his normal self, and today morning he started to have nausea or vomiting and abdominal pain concerned with this he was brought to emergency room. Wife and Dtrs reported that pt was seen at [facility] ~1 ago and dx'd w/ dysphagia post MBSS(described). Family and pt were instructed to use strict aspiration precautions then but cautioned that pt was at risk for aspiration especially of liquids. Family was informed of the option of using thickened liquids then but have not done so at home since that time. Family endorses coughing w/ thin liquids at meals at home.    Assessment / Plan / Recommendation Clinical Impression  Pt appeared to present w/ pharyngeal phase dysphagia during trials of thin liquids despite aspiration precautions utilized. Pt exhibited either immediate or delayed coughing and throat clearing when drinking small, single sips of thin liquids via cup. This presentation was NOT noted during trials of Nectar consistency liquids via cup; pt appeared to better tolerate this consistency of liquid. No immediate, overt deficits were noted w/ trials of  purees/solids. This may be the issues/dyshagia that was noted at [facility] ~1 year ago when he was assessed for dysphagia. Pt appears at increased risk for aspiration and would benefit from a Dys. 3 w/ Nectar liquids diet at this time. Would consider repeating MBSS in order to appropriately recommend dysphagia tx for d/c. Results discussed w/ pt/family in room who agreed. NSG updated.     Aspiration Risk  Moderate    Diet Recommendation Dysphagia 3 (Mech soft);Nectar   Medication Administration: Whole meds with puree Compensations: Minimize environmental distractions;Slow rate;Small sips/bites    Other  Recommendations Recommended Consults:  (Dietician) Oral Care Recommendations: Oral care BID;Patient independent with oral care (w/ setup) Other Recommendations: Order thickener from pharmacy;Prohibited food (jello, ice cream, thin soups);Remove water pitcher   Follow Up Recommendations       Frequency and Duration min 3x week  1 week   Pertinent Vitals/Pain denied    SLP Swallow Goals  see care plan   Swallow Study Prior Functional Status   pt lived at home w/ wife; baseline of dysphagia in the past year w/ overt s/s of aspiration noted during oral intake - drinking of liquids especially. Family stated pt tends to "put a lot in his mouth" and "eat too fast". Unsure of pt's baseline Cognitive status(?).    General Date of Onset: 02/21/15 Other Pertinent Information: Pt is a 81 y.o. male with a known history of hypertension, kidney stone, coronary artery disease lives with his wife and independent in his day-to-day activities, requires to use a can or walker  sometimes. As per daughter for last 3-4 days he appears little bit weaker than his normal self, and today morning he started to have nausea or vomiting and abdominal pain concerned with this he was brought to emergency room. Wife and Dtrs reported that pt was seen at [facility] ~1 ago and dx'd w/ dysphagia post MBSS(described). Family and  pt were instructed to use strict aspiration precautions then but cautioned that pt was at risk for aspiration especially of liquids. Family was informed of the option of using thickened liquids then but have not done so at home since that time. Family endorses coughing w/ thin liquids at meals at home.  Type of Study: Bedside swallow evaluation Previous Swallow Assessment: ~1 year ago at [place] Prior to this Study: Regular;Thin liquids Temperature Spikes Noted: Yes (8.6 wbc) Respiratory Status: Room air History of Recent Intubation: No Behavior/Cognition: Alert;Cooperative;Pleasant mood;Requires cueing Oral Cavity - Dentition: Adequate natural dentition/normal for age;Missing dentition Self-Feeding Abilities: Able to feed self;Needs set up Patient Positioning: Upright in chair/Tumbleform Baseline Vocal Quality: Low vocal intensity Volitional Cough: Strong Volitional Swallow: Able to elicit    Oral/Motor/Sensory Function Overall Oral Motor/Sensory Function: Appears within functional limits for tasks assessed Labial ROM: Within Functional Limits Labial Symmetry: Within Functional Limits Labial Strength: Within Functional Limits Lingual ROM: Within Functional Limits Lingual Symmetry: Within Functional Limits Lingual Strength: Within Functional Limits Facial Symmetry: Within Functional Limits Mandible: Within Functional Limits   Ice Chips Ice chips: Impaired Presentation: Spoon (3 trials) Oral Phase Impairments:  (none) Oral Phase Functional Implications:  (none) Pharyngeal Phase Impairments: Cough - Delayed;Throat Clearing - Delayed (x3/3 trials)   Thin Liquid Thin Liquid: Impaired Presentation: Cup;Self Fed (x3 trials) Oral Phase Impairments:  (none) Oral Phase Functional Implications:  (none) Pharyngeal  Phase Impairments: Cough - Immediate;Throat Clearing - Delayed (x3/3 trials)    Nectar Thick Nectar Thick Liquid: Impaired Presentation: Cup;Self Fed Oral Phase Impairments:   (none) Oral phase functional implications:  (none) Pharyngeal Phase Impairments: Throat Clearing - Delayed (x1 trial of ~3 ozs of liquid)   Honey Thick Honey Thick Liquid: Not tested   Puree Puree: Within functional limits Presentation: Self Fed;Spoon (~2 ozs)   Solid   GO    Solid: Within functional limits Presentation: Self Fed (6 trials) Other Comments: min. slower oral phase mastication time but grossly wfl      Orinda Kenner, MS, CCC-SLP  Ayse Mccartin 02/22/2015,3:19 PM

## 2015-02-22 NOTE — Care Management Important Message (Signed)
Important Message  Patient Details  Name: Phillip [name]. MRN: [id number] Date of Birth: 05/25/1926   Medicare Important Message Given:  Yes-second notification given    Shelbie Ammons, RN 02/22/2015, 8:47 AM

## 2015-02-22 NOTE — Progress Notes (Signed)
[place] [place] NAME: Phillip [name]    MR#:  [id number]  DATE OF BIRTH:  Feb 04, 1927  SUBJECTIVE:  CHIEF COMPLAINT:   Chief Complaint  Patient presents with  . Emesis  not having anymore n/v/abd pain. Sitting comfortably in the chair, wife at bedside  REVIEW OF SYSTEMS:  Review of Systems  Constitutional: Negative for fever, weight loss, malaise/fatigue and diaphoresis.  HENT: Negative for ear discharge, ear pain, hearing loss, nosebleeds, sore throat and tinnitus.   Eyes: Negative for blurred vision and pain.  Respiratory: Negative for cough, hemoptysis, shortness of breath and wheezing.   Cardiovascular: Negative for chest pain, palpitations, orthopnea and leg swelling.  Gastrointestinal: Positive for nausea, vomiting and abdominal pain. Negative for heartburn, diarrhea, constipation and blood in stool.  Genitourinary: Negative for dysuria, urgency and frequency.  Musculoskeletal: Negative for myalgias and back pain.  Skin: Negative for itching and rash.  Neurological: Negative for dizziness, tingling, tremors, focal weakness, seizures, weakness and headaches.  Psychiatric/Behavioral: Negative for depression. The patient is not nervous/anxious.    DRUG ALLERGIES:  No Known Allergies VITALS:  Blood pressure 117/68, pulse 71, temperature 98.2 F (36.8 C), temperature source Oral, resp. rate 17, height 6\' 1"  (1.854 m), weight 99.791 kg (220 lb), SpO2 98 %. PHYSICAL EXAMINATION:  Physical Exam  Constitutional: He is oriented to person, place, and time and well-developed, well-nourished, and in no distress.  HENT:  Head: Normocephalic and atraumatic.  Eyes: Conjunctivae and EOM are normal. Pupils are equal, round, and reactive to light.  Neck: Normal range of motion. Neck supple. No tracheal deviation present. No thyromegaly present.  Cardiovascular: Normal rate, regular rhythm and normal heart sounds.   Pulmonary/Chest:  Effort normal and breath sounds normal. No respiratory distress. He has no wheezes. He exhibits no tenderness.  Abdominal: Soft. Bowel sounds are normal. He exhibits no distension. There is no tenderness.  Musculoskeletal: Normal range of motion.  Neurological: He is alert and oriented to person, place, and time. No cranial nerve deficit.  Skin: Skin is warm and dry. No rash noted.  Psychiatric: Mood and affect normal.   LABORATORY PANEL:   CBC  Recent Labs Lab 02/22/15 0543  WBC 8.6  HGB 10.7*  HCT 32.1*  PLT 133*   ------------------------------------------------------------------------------------------------------------------ Chemistries   Recent Labs Lab 02/21/15 1317 02/22/15 0543  NA 136 135  K 3.4* 3.5  CL 102 103  CO2 27 27  GLUCOSE 127* 128*  BUN 18 19  CREATININE 1.17 1.33*  CALCIUM 8.7* 8.4*  AST 26  --   ALT 15*  --   ALKPHOS 56  --   BILITOT 1.2  --    RADIOLOGY:  Ct Head Wo Contrast  02/21/2015   CLINICAL DATA:  Acute onset lethargy and vomiting  EXAM: CT HEAD WITHOUT CONTRAST  TECHNIQUE: Contiguous axial images were obtained from the base of the skull through the vertex without intravenous contrast.  COMPARISON:  July 19, 2014  FINDINGS: There is age related volume loss. There is no intracranial mass, hemorrhage, extra-axial fluid collection, or midline shift. There is a focal area of decreased attenuation in the anterior left corona radiata, not present on prior study. There are 2 foci of decreased attenuation in the right anterior corona radiata, not present previously. There is mild periventricular small vessel disease in the centra semiovale, stable. Elsewhere gray-white compartments appear unremarkable. Bony calvarium appears intact. The visualized mastoid air cells are clear.  IMPRESSION: Small  superior white matter infarcts which were not present 7 months prior but do not appear acute. There is mild periventricular small vessel disease. There is age  related volume loss. There is no hemorrhage or mass effect.   Electronically Signed   By: Lowella Grip III M.D.   On: 02/21/2015 13:54   Ct Abdomen Pelvis W Contrast  02/21/2015   CLINICAL DATA:  Projectile vomiting with dizziness abdominal pain for 2 days.  EXAM: CT ABDOMEN AND PELVIS WITH CONTRAST  TECHNIQUE: Multidetector CT imaging of the abdomen and pelvis was performed using the standard protocol following bolus administration of intravenous contrast.  CONTRAST:  157mL OMNIPAQUE IOHEXOL 300 MG/ML  SOLN  COMPARISON:  None.  FINDINGS: There are numerous hepatic cysts. Pancreas, spleen, and adrenal glands appear normal. Small hypodense lesions within the left kidney are too small to definitively characterize but probably cysts. Right kidney is unremarkable. No renal stone or hydronephrosis bilaterally. No ureteral or bladder calculi identified. There is a left- sided bladder wall diverticulum. Prostate is enlarged causing mass effect on the bladder base.  Gallbladder is moderately distended but otherwise unremarkable. No pericholecystic inflammation. No adjacent common bile duct or intrahepatic bile duct dilatation.  Stomach is moderately distended but otherwise unremarkable. No gastric wall thickening or inflammation seen. Small and large bowel are normal in caliber throughout. Scattered diverticulosis noted within the descending colon but no inflammatory change to suggest acute diverticulitis. No bowel wall thickening or evidence of bowel wall inflammation seen.  No free fluid or abscess collection. No free intraperitoneal air. No significant lymphadenopathy. Appendix is not seen but there are no inflammatory changes about the cecum to suggest acute appendicitis.  Atherosclerotic changes noted along the walls of the normal- caliber abdominal aorta. Patchy consolidations are present at both lung bases, left greater than right, which could be atelectasis or pneumonia. Degenerative changes are seen  throughout the thoracolumbar spine but no acute osseous abnormality.  IMPRESSION: 1. Patchy consolidations at each lung base, left greater than right, suspicious for pneumonia or aspiration pneumonitis. Atelectasis is a possibility for the right lung base findings. Atelectasis is less likely at the left lung base. 2. Colonic diverticulosis without evidence of acute diverticulitis. 3. Gallbladder is moderately distended but otherwise unremarkable. There is questionable gallbladder wall thickening but no pericholecystic fluid or inflammation to suggest an acute cholecystitis. No bile duct dilatation. 4. Prominent prostate gland enlargement causing mass effect on the bladder base and is presumably the cause of a left-sided bladder wall diverticulum. Bladder is otherwise unremarkable. No bladder distention. No bladder stone. 5. Numerous hepatic cysts.  Probable small left renal cysts. 6. Remainder of the abdomen and pelvis is unremarkable. Overall, no evidence of acute intra-abdominal or intrapelvic abnormality. No free fluid. No bowel obstruction or evidence of bowel wall inflammation. No evidence of acute solid organ abnormality.   Electronically Signed   By: Franki Cabot M.D.   On: 02/21/2015 17:03   Dg Abd Acute W/chest  02/21/2015   CLINICAL DATA:  Vomiting for 2 days, dizziness and weakness  EXAM: DG ABDOMEN ACUTE W/ 1V CHEST  COMPARISON:  Chest x-ray dated 07/17/2014.  FINDINGS: Cardiomediastinal silhouette is stable in size and configuration. The heart size is normal. Likely some degree of age-related aortic ectasia. Lungs remain clear. No pleural effusion seen. No pneumothorax.  There are no dilated large or small bowel loops identified. Scattered nonspecific air-fluid levels are seen within the left abdomen and upper pelvis which can be an indication of underlying  gastroenteritis. No evidence of free intraperitoneal air. No evidence of soft tissue mass. No pathologic-appearing calcifications seen.   Degenerative changes are seen throughout the thoracolumbar spine and at each hip joint. Osseous structures are diffusely osteopenic. No acute osseous abnormality identified.  IMPRESSION: 1. Lungs are clear and there is no evidence of acute cardiopulmonary abnormality. 2. Scattered nonspecific air-fluid levels within the left abdomen and upper pelvis which can be an indication of underlying gastroenteritis. Additional air-fluid level noted within the stomach. 3. No convincing evidence of a bowel obstruction. No dilated large or small bowel loops seen.   Electronically Signed   By: Franki Cabot M.D.   On: 02/21/2015 14:08   ASSESSMENT AND PLAN:  * Pneumonia: Seen on CT  continue Rocephin and azithromycin. Blood cultures neg cxr in am  * Lactic acidosis: likely due to infection.  * Hypertension Continue clonidine and hydralazine will hold hydrochlorothiazide with presence of infection and lactic acidosis.  * History of coronary artery disease and stent placement Continue cardiac medications.     All the records are reviewed and case discussed with Care Management/Social Worker. Management plans discussed with the patient, family and they are in agreement.  CODE STATUS: DNR  TOTAL TIME TAKING CARE OF THIS PATIENT: 35 minutes.   More than 50% of the time was spent in counseling/coordination of care: YES  POSSIBLE D/C IN 1-2 DAYS, DEPENDING ON CLINICAL CONDITION.   [facility], Genevieve Arbaugh M.D on 02/22/2015 at 1:29 PM  Between 7am to 6pm - Pager - [phone]  After 6pm go to [email or web address] - password [email or web address]  [facility]  Office  [phone]  CC:  Primary care physician; No primary care provider on file.

## 2015-02-22 NOTE — Plan of Care (Signed)
Problem: SLP Dysphagia Goals Goal: Misc Dysphagia Goal Pt will safely tolerate po diet of least restrictive consistency w/ no overt s/s of aspiration noted by Staff/pt/family x3 sessions.    

## 2015-02-22 NOTE — Clinical Documentation Improvement (Signed)
Internal Medicine  Can the diagnosis of pneumonia be further specified?   Type of Pneumonia - CAP, HCAP, Bacterial Pneumonia, Aspiration Pneumonia, Gram Negative Pneumonia, Other Condition, Unable to Clinically Determine  Document any associated diagnoses/conditions such as Respiratory Failure, Sepsis, Underlying Lung Disease (Specify), Other (Specify)  Document history of tobacco use - past or present  Other condition  Clinically Undetermined   Supporting Information: ED: "Pt. present with emesis." CT ABD Pelvis:CLINICAL DATA: Projectile vomiting with dizziness abdominal pain for 2 days.IMPRESSION: 1. Patchy consolidations at each lung base, left greater than right, suspicious for pneumonia or aspiration pneumonitis. Atelectasis is a possibility for the right lung base findings. 02/22/15 RD assessment:  "RD notes pt with h/o dysphagia. SLP consulted."  Please update your documentation within the medical record to reflect your response to this query. Please exercise your independent, professional judgment when responding. A specific answer is not anticipated or expected.   Thank You, [place] [phone]

## 2015-02-22 NOTE — Evaluation (Signed)
Physical Therapy Evaluation Patient Details Name: Phillip [name]. MRN: [id number] DOB: [birth year] Today's Date: 02/22/2015   History of Present Illness  Pt here with pneumonia, feeling better.  Clinical Impression  Pt does well with PT eval and shows good confidence and safety with ambulation (~200 ft w/o AD) and was able to negotiate up/down 6 steps with single rail and no issues.  He has some mild fatigue with the effort, but ultimately does well and will not require further PT intervention.     Follow Up Recommendations No PT follow up    Equipment Recommendations       Recommendations for Other Services       Precautions / Restrictions Precautions Precautions: Fall Restrictions Weight Bearing Restrictions: No      Mobility  Bed Mobility Overal bed mobility: Independent             General bed mobility comments: Pt is able to get to EOB w/o assist though he does need rails and takes some extra time to gain his balance  Transfers Overall transfer level: Modified independent Equipment used: None             General transfer comment: Pt able to get to standing with heavy UE use and using back of legs on the bed, able to maintain balance w/o AD  Ambulation/Gait Ambulation/Gait assistance: Supervision Ambulation Distance (Feet): 200 Feet Assistive device: None       General Gait Details: Pt with slow, but consistent gait.  He has no LOBs and reports that he is walking near his baseline with speed and confidence.  Stairs Stairs: Yes Stairs assistance: Supervision Stair Management: One rail Right Number of Stairs: 6 General stair comments: Pt was able to safely negotiate up/down steps, no LOBs  Wheelchair Mobility    Modified Rankin (Stroke Patients Only)       Balance                                             Pertinent Vitals/Pain Pain Assessment: No/denies pain    Home Living Family/patient expects to be  discharged to:: Private residence Living Arrangements: Spouse/significant other Available Help at Discharge: Family Type of Home: House Home Access: Stairs to enter   Technical brewer of Steps: [place] - single point;Walker - 2 wheels Additional Comments: Pt reports that he almost never uses the walker, but does use a cane some times.      Prior Function Level of Independence: Independent               Hand Dominance        Extremity/Trunk Assessment   Upper Extremity Assessment: Overall WFL for tasks assessed           Lower Extremity Assessment: Overall WFL for tasks assessed         Communication   Communication: No difficulties  Cognition Arousal/Alertness: Awake/alert Behavior During Therapy: WFL for tasks assessed/performed Overall Cognitive Status: Within Functional Limits for tasks assessed                      General Comments      Exercises        Assessment/Plan    PT Assessment Patent does not need any further PT services  PT Diagnosis Difficulty walking;Generalized weakness   PT Problem List  PT Treatment Interventions     PT Goals (Current goals can be found in the Care Plan section) Acute Rehab PT Goals Patient Stated Goal: "I just wanted to go home" PT Goal Formulation: With patient Time For Goal Achievement: 03/08/15 Potential to Achieve Goals: Good    Frequency     Barriers to discharge        Co-evaluation               End of Session Equipment Utilized During Treatment: Gait belt Activity Tolerance: Patient tolerated treatment well Patient left: with chair alarm set Nurse Communication: Mobility status         Time: 2376-2831 PT Time Calculation (min) (ACUTE ONLY): 31 min   Charges:   PT Evaluation $Initial PT Evaluation Tier I: 1 Procedure     PT G Codes:       Wayne Both, PT, DPT [phone]  Kreg Shropshire 02/22/2015, 11:37 AM

## 2015-02-22 NOTE — Progress Notes (Signed)
Initial Nutrition Assessment  INTERVENTION:   Meals and Snacks: Cater to patient preferences per SLP recommendations Medical Food Supplement Therapy: will recommend Mighty Shakes on meal trays TID for added nutrition (each shake provides 300kcals and 9g protein)   NUTRITION DIAGNOSIS:   Unintentional weight loss related to acute illness as evidenced by percent weight loss.  GOAL:   Patient will meet greater than or equal to 90% of their needs  MONITOR:    (Energy Intake, Electrolyte and renal Profile, Anthropometrics)  REASON FOR ASSESSMENT:   Malnutrition Screening Tool    ASSESSMENT:   Pt admitted with pna. RD notes pt with h/o dysphagia. SLP consulted.  Past Medical History  Diagnosis Date  . Hypertension   . Benign enlargement of prostate   . Kidney stone   . Coronary artery disease   . Dysphagia     Diet Order:  Diet Heart Room service appropriate?: Yes; Fluid consistency:: Thin    Current Nutrition: Pt reports eating eggs and home fries this am. Per RN Mendel Ryder pt tolerating pills in applesauce.   Food/Nutrition-Related History: Pt reports eating 3 meals per day PTA with a good appetite. Pt reports drinking Ensures at one time, usually one a day.   Medications: vitamin D  Electrolyte/Renal Profile and Glucose Profile:   Recent Labs Lab 02/21/15 1317 02/22/15 0543  NA 136 135  K 3.4* 3.5  CL 102 103  CO2 27 27  BUN 18 19  CREATININE 1.17 1.33*  CALCIUM 8.7* 8.4*  GLUCOSE 127* 128*   Protein Profile:  Recent Labs Lab 02/21/15 1317  ALBUMIN 3.7    Gastrointestinal Profile: Last BM:  02/20/2015   Nutrition-Focused Physical Exam Findings: Nutrition-Focused physical exam completed. Findings are WDL for fat depletion, muscle depletion, and edema.    Weight Change: Pt reports 20lbs weight loss in the past 3 months. Pt reports usual body weight of 240lbs (8% weight loss in 3 months)   Skin:  Reviewed, no issues  Last BM:   02/20/2015  Height:   Ht Readings from Last 1 Encounters:  02/21/15 6\' 1"  (1.854 m)    Weight:   Wt Readings from Last 1 Encounters:  02/21/15 220 lb (99.791 kg)     BMI:  Body mass index is 29.03 kg/(m^2).  Estimated Nutritional Needs:   Kcal:  BEE: 1782kcals, TEE: (IF 1.0-1.2)(AF 1.2) 2138-2566kcals  Protein:  80-100g protein (0.8-1.0g/kg)   Fluid:  2493-2952mL of fluid (25-86mL/kg)   EDUCATION NEEDS:   Education needs no appropriate at this time    [place], RD, LDN Pager [phone]

## 2015-02-22 NOTE — Plan of Care (Signed)
Problem: Discharge Progression Outcomes Goal: Other Discharge Outcomes/Goals Outcome: Progressing Patient walked with PT, did well, Patient alert and oriented to person, place, and time, very HOH, family at bedside today, blood pressure much improved with medication, speech came by due to history of dysphagia and dx of pneumonia, swallows pills whole in applesauce, Patient sat up in chair most of shift.  No pain, VSS afebrile this shift, planning to discharge to home when appropriate.

## 2015-02-22 NOTE — Care Management (Signed)
Admitted to [facility] with the diagnosis of pneumonia. Lives with wife, Charleston Ropes, [phone]).  Last was at [facility] about 1 month ago.  "Did see Dr. Felipa Evener, but he doesn't work at the Clinic anymore." No Home Health. No skilled facility.Uses a cane as needed to aid in ambulation. [place] about a month ago getting out of the bathroom. Good appetite. Takes care of all basic activities of daily living himself, doesn't drive anymore. Wife will transport home. No Life Alert in the home. 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 Shelbie Ammons RN MSN Care Management [phone]

## 2015-02-22 NOTE — Plan of Care (Signed)
Problem: Discharge Progression Outcomes Goal: Discharge plan in place and appropriate Outcome: Progressing Pt is from home with wife High Fall Risk Hx HTN, kidney stone, CAD, dysphagia, continue on home medications Goal: Other Discharge Outcomes/Goals Outcome: Progressing Pt is alert to self, location and situation. C/o stomach pain, scheduled tylenol given with relief. Impulsive at times, BP improving. Resting quietly, continue to assess.

## 2015-02-23 ENCOUNTER — Inpatient Hospital Stay: Payer: Medicare Other

## 2015-02-23 DIAGNOSIS — K529 Noninfective gastroenteritis and colitis, unspecified: Secondary | ICD-10-CM | POA: Diagnosis present

## 2015-02-23 DIAGNOSIS — R112 Nausea with vomiting, unspecified: Secondary | ICD-10-CM | POA: Diagnosis present

## 2015-02-23 LAB — CBC
HCT: 32.1 % — ABNORMAL LOW (ref 40.0–52.0)
Hemoglobin: 10.8 g/dL — ABNORMAL LOW (ref 13.0–18.0)
MCH: 29.2 pg (ref 26.0–34.0)
MCHC: 33.5 g/dL (ref 32.0–36.0)
MCV: 87.4 fL (ref 80.0–100.0)
PLATELETS: 117 10*3/uL — AB (ref 150–440)
RBC: 3.68 MIL/uL — AB (ref 4.40–5.90)
RDW: 16.9 % — ABNORMAL HIGH (ref 11.5–14.5)
WBC: 6 10*3/uL (ref 3.8–10.6)

## 2015-02-23 LAB — BASIC METABOLIC PANEL
ANION GAP: 6 (ref 5–15)
BUN: 23 mg/dL — AB (ref 6–20)
CALCIUM: 8.6 mg/dL — AB (ref 8.9–10.3)
CO2: 28 mmol/L (ref 22–32)
Chloride: 102 mmol/L (ref 101–111)
Creatinine, Ser: 1.37 mg/dL — ABNORMAL HIGH (ref 0.61–1.24)
GFR calc Af Amer: 52 mL/min — ABNORMAL LOW (ref >60)
GFR, EST NON AFRICAN AMERICAN: 45 mL/min — AB (ref >60)
Glucose, Bld: 117 mg/dL — ABNORMAL HIGH (ref 65–99)
POTASSIUM: 3.5 mmol/L (ref 3.5–5.1)
SODIUM: 136 mmol/L (ref 135–145)

## 2015-02-23 MED ORDER — GUAIFENESIN-DM 100-10 MG/5ML PO SYRP: 5.0000 mL | ORAL_SOLUTION | ORAL | Status: DC | PRN

## 2015-02-23 MED ORDER — MOXIFLOXACIN HCL 400 MG PO TABS: 400.0000 mg | ORAL_TABLET | Freq: Every day | ORAL | Status: DC

## 2015-02-23 NOTE — Progress Notes (Signed)
VSS. O2 sats in the high 90's on room air. Pt denies pain. Discharge instructions given and explained to pt and family. Prescription given. Educational handouts about moxifloxacin and cardiac diet given and explained to pt and family. Escorted on a wheelchair.

## 2015-02-23 NOTE — Plan of Care (Signed)
Problem: Discharge Progression Outcomes Goal: Other Discharge Outcomes/Goals Outcome: Progressing Plan of care progress to goals: Discharge plan- pt would like to discharge back to home at time of discharge.  Activity appropriate for discharge- pt up to bedside with 1 assist.  Other discharge outcomes-Chest xray ordered for this morning if improved, possible discharge to home.   A&O pt rested well this shift. No c/o pain or discomfort noted. VSS, Voids using urinal with some incontinent noted. Medications whole with applesause, dysphagia III with necter thick liquids. High fall risk, bed alarm activated.

## 2015-02-23 NOTE — Discharge Instructions (Signed)

## 2015-02-23 NOTE — Progress Notes (Signed)
Speech Language Pathology Treatment: Dysphagia  Patient Details Name: Phillip [name]. MRN: [id number] DOB: 1926-06-18 Today's Date: 02/23/2015 Time: 1050-1150 SLP Time Calculation (min) (ACUTE ONLY): 60 min  Assessment / Plan / Recommendation Clinical Impression  Pt presents w/ increased risk for aspiration; aspiration of thin liquids as has had been dx'd during a swallow study at [facility] per family report, and per family's witnessed report. During this session, pt appeared to adequately tolerate trials of Nectar consistency liquids via cup following general aspiration precautions/min. verbal cues (to include small, single sips) w/ mild throat clearing noted x1 w/ ~4ozs of Nectar liquids. Family has indicated pt is poor at self-monitoring bolus amounts and often takes "large drinks of liquids". Rec. continue w/ Nectar liquids during meals to reduce risk for aspiration; aspiration precautions; meds in Puree - whole as tolerates. Rec. f/u ST services HH vs. SNF for further education and support/dysphagia tx as indicated based on the recommendations of the swallow study from [facility]. Pt may benefit from another objective swallow assessment in the future per MD. NSG updated. ST will f/u w/ toleration of diet; education w/ family/pt on thickened liquids; preparation; and ordering information of product. Reviewed drink options at home and prepration. Also gave ordering information for the Dysphagia Drink Cup to aid in drinking liquids.    HPI  pt was dx'd w/ dysphagia post f/u w/ [facility] ~1 year ago and was rec'd to f/u w/ ST services as well as potential need to use thickened liquids. Pt's wife stated they had NOT been using any thickened liquids since that time and that pt does exhibit coughing when drinking liquids during meals; Dtrs agreed. Pt and wife live in their own home.   Pertinent Vitals Pain Assessment: No/denies pain  SLP Plan  Continue with current plan of care (F/u [facility] ST services  rec'd)    Recommendations Diet recommendations: Dysphagia 3 (mechanical soft);Nectar-thick liquid Liquids provided via: Cup;No straw Medication Administration: Whole meds with puree Supervision: Patient able to self feed;Intermittent supervision to cue for compensatory strategies Compensations: Minimize environmental distractions;Slow rate;Small sips/bites Postural Changes and/or Swallow Maneuvers: Seated upright 90 degrees              General recommendations: Rehab consult Mayer Masker [facility]) Oral Care Recommendations: Oral care BID;Patient independent with oral care (supervision) Follow up Recommendations: Home health SLP Plan: Continue with current plan of care (F/u [facility] services rec'd)    [place], MS, CCC-SLP  Watson,Katherine 02/23/2015, 11:54 AM

## 2015-02-24 LAB — URINE CULTURE

## 2015-02-25 NOTE — Discharge Summary (Signed)
[facility] [place] NAME: Phillip [name]    MR#:  [id number]  DATE OF BIRTH:  1926-05-30  DATE OF ADMISSION:  02/21/2015 ADMITTING PHYSICIAN: Vaughan Basta, MD  DATE OF DISCHARGE: 02/23/2015 11:39 AM  PRIMARY CARE PHYSICIAN: No primary care provider on file.    ADMISSION DIAGNOSIS:  Transient alteration of awareness [R40.4] Non-intractable vomiting without nausea, vomiting of unspecified type [R11.11] Aspiration pneumonia of lower lobe, unspecified aspiration pneumonia type, unspecified laterality (Jud) [J69.0]  DISCHARGE DIAGNOSIS:  Principal Problem:   Pneumonia Active Problems:   Gastroenteritis   Nausea with vomiting   SECONDARY DIAGNOSIS:   Past Medical History  Diagnosis Date  . Hypertension   . Benign enlargement of prostate   . Kidney stone   . Coronary artery disease   . Dysphagia      ADMITTING HISTORY  Phillip [name] is a 81 y.o. male with a known history of hypertension, kidney stone, coronary artery disease lives with his wife and independent in his day-to-day activities, requires to use a can or walker sometimes. As per daughter for last 3-4 days he appears little bit weaker than his normal self, and today morning he started to have nausea or vomiting and abdominal pain concerned with this he was brought to emergency room. In ER initial workup showed his lactic acid was higher but x-ray abdomen was negative so CT scan of abdomen and chest was done and it showed infiltrate in the lower lobes of the lungs so he is given to hospitalist team for further management of his pneumonia. He agrees that he had some chills but denies any fever or sputum production.   HOSPITAL COURSE:   * Pneumonia: Seen on CT  continued IV Rocephin and azithromycin. Blood cultures neg Repeat chest x-ray stable Change to oral antibiotics for 1 more week after discharge.  * Lactic acidosis: likely due to  infection. Resolved  * Hypertension Continue home medications  * History of coronary artery disease and stent placement Continue cardiac medications.  * Gastritis resolved  Patient has ambulated in the hallway by physical therapy. Saturations normal. Afebrile. Normal WBC.  CONSULTS OBTAINED:     DRUG ALLERGIES:  No Known Allergies  DISCHARGE MEDICATIONS:   Discharge Medication List as of 02/23/2015 11:13 AM    START taking these medications   Details  guaiFENesin-dextromethorphan (ROBITUSSIN DM) 100-10 MG/5ML syrup Take 5 mLs by mouth every 4 (four) hours as needed for cough., Starting 02/23/2015, Until Discontinued, Normal    moxifloxacin (AVELOX) 400 MG tablet Take 1 tablet (400 mg total) by mouth daily at 8 pm., Starting 02/23/2015, Until Discontinued, Print      CONTINUE these medications which have NOT CHANGED   Details  acetaminophen (TYLENOL) 325 MG tablet Take 325-650 mg by mouth 3 (three) times daily., Until Discontinued, Historical Med    alfuzosin (UROXATRAL) 10 MG 24 hr tablet Take 10 mg by mouth at bedtime., Until Discontinued, Historical Med    cholecalciferol (VITAMIN D) 1000 UNITS tablet Take 1,000 Units by mouth 2 (two) times daily., Until Discontinued, Historical Med    cloNIDine (CATAPRES) 0.2 MG tablet Take 0.2 mg by mouth 2 (two) times daily., Until Discontinued, Historical Med    clopidogrel (PLAVIX) 75 MG tablet Take 75 mg by mouth daily., Until Discontinued, Historical Med    hydrALAZINE (APRESOLINE) 50 MG tablet Take 50 mg by mouth 3 (three) times daily., Until Discontinued, Historical Med    hydrochlorothiazide (HYDRODIURIL) 25 MG  tablet Take 25 mg by mouth daily., Until Discontinued, Historical Med    isosorbide dinitrate (ISORDIL) 30 MG tablet Take 30 mg by mouth daily., Until Discontinued, Historical Med    metoprolol (LOPRESSOR) 50 MG tablet Take 50 mg by mouth 2 (two) times daily., Until Discontinued, Historical Med    Saline 0.9 % AERS  Place 1 spray into the nose 4 (four) times daily., Until Discontinued, Historical Med    simvastatin (ZOCOR) 40 MG tablet Take 40 mg by mouth at bedtime., Until Discontinued, Historical Med         Today    VITAL SIGNS:  Blood pressure 163/74, pulse 78, temperature 98.7 F (37.1 C), temperature source Oral, resp. rate 20, height 6\' 1"  (1.854 m), weight 99.791 kg (220 lb), SpO2 98 %.  I/O:  No intake or output data in the 24 hours ending 02/25/15 1404  PHYSICAL EXAMINATION:  Physical Exam  GENERAL:  81 y.o.-year-old patient lying in the bed with no acute distress.  LUNGS: Normal breath sounds bilaterally, no wheezing, rales,rhonchi or crepitation. No use of accessory muscles of respiration.  CARDIOVASCULAR: S1, S2 normal. No murmurs, rubs, or gallops.  ABDOMEN: Soft, non-tender, non-distended. Bowel sounds present. No organomegaly or mass.  NEUROLOGIC: Moves all 4 extremities. PSYCHIATRIC: The patient is alert and oriented x 3.  SKIN: No obvious rash, lesion, or ulcer.   DATA REVIEW:   CBC  Recent Labs Lab 02/23/15 0450  WBC 6.0  HGB 10.8*  HCT 32.1*  PLT 117*    Chemistries   Recent Labs Lab 02/21/15 1317  02/23/15 0450  NA 136  < > 136  K 3.4*  < > 3.5  CL 102  < > 102  CO2 27  < > 28  GLUCOSE 127*  < > 117*  BUN 18  < > 23*  CREATININE 1.17  < > 1.37*  CALCIUM 8.7*  < > 8.6*  AST 26  --   --   ALT 15*  --   --   ALKPHOS 56  --   --   BILITOT 1.2  --   --   < > = values in this interval not displayed.  Cardiac Enzymes  Recent Labs Lab 02/21/15 1317  TROPONINI <0.03    Microbiology Results  Results for orders placed or performed during the hospital encounter of 02/21/15  Urine culture     Status: None   Collection Time: 02/21/15  1:17 PM  Result Value Ref Range Status   Specimen Description URINE, CLEAN CATCH  Final   Special Requests NONE  Final   Culture >=100,000 COLONIES/mL AEROCOCCUS URINAE  Final   Report Status 02/24/2015 FINAL   Final  Culture, blood (routine x 2)     Status: None (Preliminary result)   Collection Time: 02/21/15  5:38 PM  Result Value Ref Range Status   Specimen Description BLOOD RIGHT WRIST  Final   Special Requests   Final    BOTTLES DRAWN AEROBIC AND ANAEROBIC 3CC AEROBIC,3CCANAEROBIC   Culture NO GROWTH 4 DAYS  Final   Report Status PENDING  Incomplete  Culture, blood (routine x 2)     Status: None (Preliminary result)   Collection Time: 02/21/15  6:02 PM  Result Value Ref Range Status   Specimen Description BLOOD LEFT WRIST  Final   Special Requests   Final    BOTTLES DRAWN AEROBIC AND ANAEROBIC 2CC AEROBIC,2CCANAEROBIC   Culture NO GROWTH 4 DAYS  Final   Report Status PENDING  Incomplete    RADIOLOGY:  No results found.    Follow up with PCP in 1 week.  Management plans discussed with the patient, family and they are in agreement.  CODE STATUS:   TOTAL TIME TAKING CARE OF THIS PATIENT ON DAY OF DISCHARGE: more than 30 minutes.    Hillary Bow R M.D on 02/25/2015 at 2:04 PM  Between 7am to 6pm - Pager - [phone]  After 6pm go to [email or web address] - password [email or web address] [facility]  [facility]  Office  [phone]  CC: Primary care physician; No primary care provider on file.     Note: This dictation was prepared with Dragon dictation along with smaller phrase technology. Any transcriptional errors that result from this process are unintentional.

## 2015-02-27 LAB — CULTURE, BLOOD (ROUTINE X 2)
Culture: NO GROWTH
Culture: NO GROWTH

## 2015-03-27 ENCOUNTER — Emergency Department: Payer: Medicare Other

## 2015-03-27 ENCOUNTER — Encounter: Payer: Self-pay | Admitting: *Deleted

## 2015-03-27 ENCOUNTER — Inpatient Hospital Stay
Admission: EM | Admit: 2015-03-27 | Discharge: 2015-03-29 | DRG: 194 | Disposition: A | Discharge status: Skilled Nursing Facility | Payer: Medicare Other | Attending: Internal Medicine | Admitting: Internal Medicine

## 2015-03-27 DIAGNOSIS — Z79899 Other long term (current) drug therapy: Secondary | ICD-10-CM | POA: Diagnosis not present

## 2015-03-27 DIAGNOSIS — R7881 Bacteremia: Secondary | ICD-10-CM | POA: Diagnosis present

## 2015-03-27 DIAGNOSIS — J189 Pneumonia, unspecified organism: Secondary | ICD-10-CM | POA: Diagnosis present

## 2015-03-27 DIAGNOSIS — Z87891 Personal history of nicotine dependence: Secondary | ICD-10-CM

## 2015-03-27 DIAGNOSIS — Z7902 Long term (current) use of antithrombotics/antiplatelets: Secondary | ICD-10-CM | POA: Diagnosis not present

## 2015-03-27 DIAGNOSIS — I517 Cardiomegaly: Secondary | ICD-10-CM | POA: Diagnosis present

## 2015-03-27 DIAGNOSIS — B9620 Unspecified Escherichia coli [E. coli] as the cause of diseases classified elsewhere: Secondary | ICD-10-CM | POA: Diagnosis present

## 2015-03-27 DIAGNOSIS — R0602 Shortness of breath: Secondary | ICD-10-CM

## 2015-03-27 DIAGNOSIS — N400 Enlarged prostate without lower urinary tract symptoms: Secondary | ICD-10-CM | POA: Diagnosis present

## 2015-03-27 DIAGNOSIS — Y95 Nosocomial condition: Secondary | ICD-10-CM | POA: Diagnosis present

## 2015-03-27 DIAGNOSIS — R531 Weakness: Secondary | ICD-10-CM

## 2015-03-27 DIAGNOSIS — I2510 Atherosclerotic heart disease of native coronary artery without angina pectoris: Secondary | ICD-10-CM | POA: Diagnosis present

## 2015-03-27 DIAGNOSIS — I248 Other forms of acute ischemic heart disease: Secondary | ICD-10-CM | POA: Diagnosis present

## 2015-03-27 DIAGNOSIS — I10 Essential (primary) hypertension: Secondary | ICD-10-CM | POA: Diagnosis present

## 2015-03-27 DIAGNOSIS — Z7984 Long term (current) use of oral hypoglycemic drugs: Secondary | ICD-10-CM | POA: Diagnosis not present

## 2015-03-27 LAB — CBC
HEMATOCRIT: 39.6 % — AB (ref 40.0–52.0)
HEMOGLOBIN: 12.8 g/dL — AB (ref 13.0–18.0)
MCH: 28.8 pg (ref 26.0–34.0)
MCHC: 32.4 g/dL (ref 32.0–36.0)
MCV: 89 fL (ref 80.0–100.0)
Platelets: 100 10*3/uL — ABNORMAL LOW (ref 150–440)
RBC: 4.45 MIL/uL (ref 4.40–5.90)
RDW: 15.9 % — AB (ref 11.5–14.5)
WBC: 14.6 10*3/uL — ABNORMAL HIGH (ref 3.8–10.6)

## 2015-03-27 LAB — TROPONIN I: Troponin I: 0.11 ng/mL — ABNORMAL HIGH (ref <0.031)

## 2015-03-27 LAB — URINALYSIS COMPLETE WITH MICROSCOPIC (ARMC ONLY)
Bilirubin Urine: NEGATIVE
GLUCOSE, UA: NEGATIVE mg/dL
HGB URINE DIPSTICK: NEGATIVE
Ketones, ur: NEGATIVE mg/dL
Leukocytes, UA: NEGATIVE
NITRITE: NEGATIVE
Protein, ur: 30 mg/dL — AB
SPECIFIC GRAVITY, URINE: 1.019 (ref 1.005–1.030)
pH: 5 (ref 5.0–8.0)

## 2015-03-27 LAB — BASIC METABOLIC PANEL
Anion gap: 8 (ref 5–15)
BUN: 15 mg/dL (ref 6–20)
CO2: 27 mmol/L (ref 22–32)
Calcium: 9.3 mg/dL (ref 8.9–10.3)
Chloride: 104 mmol/L (ref 101–111)
Creatinine, Ser: 1.11 mg/dL (ref 0.61–1.24)
GFR calc Af Amer: >60 mL/min (ref >60)
GFR, EST NON AFRICAN AMERICAN: 58 mL/min — AB (ref >60)
GLUCOSE: 122 mg/dL — AB (ref 65–99)
POTASSIUM: 3.2 mmol/L — AB (ref 3.5–5.1)
Sodium: 139 mmol/L (ref 135–145)

## 2015-03-27 LAB — LACTIC ACID, PLASMA
Lactic Acid, Venous: 1.5 mmol/L (ref 0.5–2.0)
Lactic Acid, Venous: 1.7 mmol/L (ref 0.5–2.0)

## 2015-03-27 MED ORDER — GUAIFENESIN-DM 100-10 MG/5ML PO SYRP
5.0000 mL | ORAL_SOLUTION | Freq: Four times a day (QID) | ORAL | Status: DC | PRN
  Filled 2015-03-27: qty 5

## 2015-03-27 MED ORDER — METOPROLOL TARTRATE 50 MG PO TABS
50.0000 mg | ORAL_TABLET | Freq: Two times a day (BID) | ORAL | Status: DC
  Administered 2015-03-27 – 2015-03-29 (×5): 50 mg via ORAL
  Filled 2015-03-27 (×5): qty 1

## 2015-03-27 MED ORDER — ALBUTEROL SULFATE (2.5 MG/3ML) 0.083% IN NEBU: 2.5000 mg | INHALATION_SOLUTION | RESPIRATORY_TRACT | Status: DC | PRN

## 2015-03-27 MED ORDER — ONDANSETRON HCL 4 MG PO TABS: 4.0000 mg | ORAL_TABLET | Freq: Four times a day (QID) | ORAL | Status: DC | PRN

## 2015-03-27 MED ORDER — HYDRALAZINE HCL 50 MG PO TABS
50.0000 mg | ORAL_TABLET | Freq: Three times a day (TID) | ORAL | Status: DC
  Administered 2015-03-27 – 2015-03-28 (×3): 50 mg via ORAL
  Filled 2015-03-27 (×3): qty 1

## 2015-03-27 MED ORDER — PIPERACILLIN-TAZOBACTAM 3.375 G IVPB
3.3750 g | Freq: Once | INTRAVENOUS | Status: AC
  Administered 2015-03-27: 3.375 g via INTRAVENOUS
  Filled 2015-03-27: qty 50

## 2015-03-27 MED ORDER — ACETAMINOPHEN 650 MG RE SUPP
650.0000 mg | Freq: Four times a day (QID) | RECTAL | Status: DC | PRN
Start: 2015-03-27 — End: 2015-03-29

## 2015-03-27 MED ORDER — ONDANSETRON HCL 4 MG/2ML IJ SOLN: 4.0000 mg | Freq: Four times a day (QID) | INTRAMUSCULAR | Status: DC | PRN

## 2015-03-27 MED ORDER — CLOPIDOGREL BISULFATE 75 MG PO TABS
75.0000 mg | ORAL_TABLET | Freq: Every day | ORAL | Status: DC
  Administered 2015-03-28 – 2015-03-29 (×2): 75 mg via ORAL
  Filled 2015-03-27 (×2): qty 1

## 2015-03-27 MED ORDER — HYDROCHLOROTHIAZIDE 25 MG PO TABS
25.0000 mg | ORAL_TABLET | Freq: Every day | ORAL | Status: DC
  Administered 2015-03-27 – 2015-03-29 (×3): 25 mg via ORAL
  Filled 2015-03-27 (×3): qty 1

## 2015-03-27 MED ORDER — VANCOMYCIN HCL 10 G IV SOLR
1250.0000 mg | Freq: Once | INTRAVENOUS | Status: AC
  Administered 2015-03-27: 1250 mg via INTRAVENOUS
  Filled 2015-03-27: qty 1250

## 2015-03-27 MED ORDER — SIMVASTATIN 40 MG PO TABS
40.0000 mg | ORAL_TABLET | Freq: Every day | ORAL | Status: DC
  Administered 2015-03-27 – 2015-03-28 (×2): 40 mg via ORAL
  Filled 2015-03-27 (×2): qty 1

## 2015-03-27 MED ORDER — ENOXAPARIN SODIUM 40 MG/0.4ML ~~LOC~~ SOLN
40.0000 mg | SUBCUTANEOUS | Status: DC
  Administered 2015-03-27 – 2015-03-28 (×2): 40 mg via SUBCUTANEOUS
  Filled 2015-03-27 (×2): qty 0.4

## 2015-03-27 MED ORDER — SODIUM CHLORIDE 0.9 % IV SOLN
INTRAVENOUS | Status: AC
  Administered 2015-03-27: 13:00:00 via INTRAVENOUS

## 2015-03-27 MED ORDER — PIPERACILLIN-TAZOBACTAM 3.375 G IVPB 30 MIN
3.3750 g | Freq: Three times a day (TID) | INTRAVENOUS | Status: DC
  Administered 2015-03-27 – 2015-03-29 (×6): 3.375 g via INTRAVENOUS
  Filled 2015-03-27 (×8): qty 50

## 2015-03-27 MED ORDER — ACETAMINOPHEN 325 MG PO TABS: 650.0000 mg | ORAL_TABLET | Freq: Four times a day (QID) | ORAL | Status: DC | PRN

## 2015-03-27 MED ORDER — DOCUSATE SODIUM 100 MG PO CAPS
100.0000 mg | ORAL_CAPSULE | Freq: Two times a day (BID) | ORAL | Status: DC
  Administered 2015-03-27 – 2015-03-29 (×5): 100 mg via ORAL
  Filled 2015-03-27 (×5): qty 1

## 2015-03-27 MED ORDER — SALINE SPRAY 0.65 % NA SOLN
1.0000 | Freq: Four times a day (QID) | NASAL | Status: DC
  Administered 2015-03-28 – 2015-03-29 (×2): 1 via NASAL
  Filled 2015-03-27: qty 44

## 2015-03-27 MED ORDER — VANCOMYCIN HCL 10 G IV SOLR
1250.0000 mg | INTRAVENOUS | Status: DC
  Administered 2015-03-28 – 2015-03-29 (×2): 1250 mg via INTRAVENOUS
  Filled 2015-03-27 (×3): qty 1250

## 2015-03-27 MED ORDER — ASPIRIN EC 325 MG PO TBEC
325.0000 mg | DELAYED_RELEASE_TABLET | Freq: Every day | ORAL | Status: DC
  Administered 2015-03-27 – 2015-03-29 (×3): 325 mg via ORAL
  Filled 2015-03-27 (×3): qty 1

## 2015-03-27 MED ORDER — CLONIDINE HCL 0.1 MG PO TABS
0.2000 mg | ORAL_TABLET | Freq: Two times a day (BID) | ORAL | Status: DC
  Administered 2015-03-27 – 2015-03-29 (×4): 0.2 mg via ORAL
  Filled 2015-03-27 (×4): qty 2

## 2015-03-27 MED ORDER — CLOPIDOGREL BISULFATE 75 MG PO TABS
75.0000 mg | ORAL_TABLET | Freq: Once | ORAL | Status: AC
  Administered 2015-03-27: 75 mg via ORAL
  Filled 2015-03-27: qty 1

## 2015-03-27 MED ORDER — ISOSORBIDE DINITRATE 10 MG PO TABS
30.0000 mg | ORAL_TABLET | Freq: Every day | ORAL | Status: DC
Start: 2015-03-27 — End: 2015-03-29
  Administered 2015-03-27 – 2015-03-29 (×3): 30 mg via ORAL
  Filled 2015-03-27 (×3): qty 3

## 2015-03-27 MED ORDER — ACETAMINOPHEN 325 MG PO TABS
325.0000 mg | ORAL_TABLET | Freq: Three times a day (TID) | ORAL | Status: DC
  Administered 2015-03-27 – 2015-03-28 (×3): 325 mg via ORAL
  Filled 2015-03-27 (×3): qty 1

## 2015-03-27 MED ORDER — HYDRALAZINE HCL 25 MG PO TABS
50.0000 mg | ORAL_TABLET | Freq: Once | ORAL | Status: AC
  Administered 2015-03-27: 50 mg via ORAL
  Filled 2015-03-27: qty 2

## 2015-03-27 MED ORDER — CLONIDINE HCL 0.1 MG PO TABS
0.2000 mg | ORAL_TABLET | Freq: Once | ORAL | Status: AC
  Administered 2015-03-27: 0.2 mg via ORAL
  Filled 2015-03-27: qty 2

## 2015-03-27 MED ORDER — ALFUZOSIN HCL ER 10 MG PO TB24
10.0000 mg | ORAL_TABLET | Freq: Every day | ORAL | Status: DC
  Administered 2015-03-27 – 2015-03-28 (×2): 10 mg via ORAL
  Filled 2015-03-27 (×3): qty 1

## 2015-03-27 MED ORDER — SODIUM CHLORIDE 0.9 % IJ SOLN
3.0000 mL | Freq: Two times a day (BID) | INTRAMUSCULAR | Status: DC
  Administered 2015-03-27 – 2015-03-29 (×6): 3 mL via INTRAVENOUS

## 2015-03-27 MED ORDER — POTASSIUM CHLORIDE 20 MEQ PO PACK
40.0000 meq | PACK | Freq: Once | ORAL | Status: AC
  Administered 2015-03-27: 40 meq via ORAL
  Filled 2015-03-27: qty 2

## 2015-03-27 MED ORDER — VITAMIN D 1000 UNITS PO TABS
1000.0000 [IU] | ORAL_TABLET | Freq: Two times a day (BID) | ORAL | Status: DC
  Administered 2015-03-27 – 2015-03-28 (×3): 1000 [IU] via ORAL
  Filled 2015-03-27 (×3): qty 1

## 2015-03-27 NOTE — Progress Notes (Signed)
ANTIBIOTIC CONSULT NOTE - INITIAL  Pharmacy Consult for Vancomycin Indication: pneumonia/ ?HCAP  No Known Allergies  Patient Measurements: Height: 6\' 3"  (190.5 cm) Weight: 210 lb (95.255 kg) IBW/kg (Calculated) : 84.5 Adjusted Body Weight: 88  Vital Signs: Temp: 98.3 F (36.8 C) (11/09 0556) Temp Source: Oral (11/09 0556) BP: 144/69 mmHg (11/09 1045) Pulse Rate: 67 (11/09 1045) Intake/Output from previous day:   Intake/Output from this shift:    Labs:  Recent Labs  03/27/15 0602  WBC 14.6*  HGB 12.8*  PLT 100*  CREATININE 1.11   Estimated Creatinine Clearance: 57.1 mL/min (by C-G formula based on Cr of 1.11).   Microbiology: No results found for this or any previous visit (from the past 720 hour(s)).  Medical History: Past Medical History  Diagnosis Date  . Hypertension   . Benign enlargement of prostate   . Kidney stone   . Coronary artery disease   . Dysphagia     Medications:  Scheduled:   Anti-infectives    Start     Dose/Rate Route Frequency Ordered Stop   03/28/15 1200  vancomycin (VANCOCIN) 1,250 mg in sodium chloride 0.9 % 250 mL IVPB     1,250 mg 166.7 mL/hr over 90 Minutes Intravenous Every 18 hours 03/27/15 1055     03/27/15 1800  vancomycin (VANCOCIN) 1,250 mg in sodium chloride 0.9 % 250 mL IVPB     1,250 mg 166.7 mL/hr over 90 Minutes Intravenous  Once 03/27/15 1055     03/27/15 1000  vancomycin (VANCOCIN) 1,250 mg in sodium chloride 0.9 % 250 mL IVPB     1,250 mg 166.7 mL/hr over 90 Minutes Intravenous  Once 03/27/15 0957     03/27/15 0900  piperacillin-tazobactam (ZOSYN) IVPB 3.375 g     3.375 g 12.5 mL/hr over 240 Minutes Intravenous  Once 03/27/15 0857 03/27/15 1028     Assessment: 81 yo male admitted with general weakness d/t HCAP? Patient with recent hospital admit 10/6-10/8 for PNA.  Patient has received Zosyn x 1 in ER. Vancomycin consult. Ke 0.052  T1/2 13.33 Vd 66.5   Goal of Therapy:  Vancomycin trough level 15-20  mcg/ml  Plan:  Measure antibiotic drug levels at steady state Follow up culture results  Will order Vancomycin 1250mg  IV x1 (given in ER). Will order Vancomycin 1250mg  2nd dose ~7 hrs later for stacked dosing. Will then continue with Vancomycin 1250mg  IV q18h. Vancomycin trough ordered prior to 4th dose on 11/11 at [facility] Pharmacist 03/27/2015 11:03 AM

## 2015-03-27 NOTE — Progress Notes (Signed)
Notified Dr. Jannifer Franklin of pos blood cultures with gram -rods in anearobic bottle. No new orders

## 2015-03-27 NOTE — Progress Notes (Signed)
Initial Nutrition Assessment    INTERVENTION:   Meals and Snacks: discussed home diet of Soft foods with Nectar Thick liquids with MD Gouru. MD gave order to change current diet to Dysphagia III, Nectar Thick; writer called down to obtain lunch tray for pt. Discussed with Leah RN, RN to monitor pt's swallowing today with plan to consult SLP if concern for aspiration Medical Food Supplement Therapy: recommend addition of Mighty Shakes BID, add Magic Cup daily   NUTRITION DIAGNOSIS:   Swallowing difficulty related to dysphagia as evidenced by  (thickened liquid diet at baseline).  GOAL:   Patient will meet greater than or equal to 90% of their needs  MONITOR:    (Energy Intake, Anthropometrics, Electrolyte/Renal Profile)  REASON FOR ASSESSMENT:   Malnutrition Screening Tool    ASSESSMENT:    Pt admitted with generalized weakness, pneumonia, malignant HTN; hx of dysphagia. Pt alert, family at bedside  Past Medical History  Diagnosis Date  . Hypertension   . Benign enlargement of prostate   . Kidney stone   . Coronary artery disease   . Dysphagia    Diet Order:  Heart Healthy  Energy Intake: pt had not eaten anything yet today, meal tray held as family reporting that pt takens thickened liquids at home  Food and Nutrition Related History: Reports good appetite prior to admission; eating 3 good meals per day, Ensure sometimes. Family also reports that pt is on thickened liquids at home (wife had a nectar thick tea in her purse that she brought from home). Noted pt discharged on Dysphagia III, Nectar Thick diet 1 month ago after SLP evaluation.  Electrolyte and Renal Profile:  Recent Labs Lab 03/27/15 0602  BUN 15  CREATININE 1.11  NA 139  K 3.2*   Glucose Profile: No results for input(s): GLUCAP in the last 72 hours.   Meds: NS at 75 ml.hr, potassium chloride  Nutrition Focused Physical Exam: Nutrition-Focused physical exam completed. Findings are no fat  depletion, mild muscle depletion, and no edema.   Height:   Ht Readings from Last 1 Encounters:  03/27/15 6\' 3"  (1.905 m)    Weight: 4.5% wt loss in 1 month based on wt encounters. Pt reports he has lost lots of weight; pt thinks he weighs ariound 177 pounds, family thinking pt weighed around 230 pounds.   Wt Readings from Last 1 Encounters:  03/27/15 210 lb (95.255 kg)   Wt Readings from Last 10 Encounters:  03/27/15 210 lb (95.255 kg)  02/21/15 220 lb (99.791 kg)    BMI:  Body mass index is 26.25 kg/(m^2).    LOW Care Level  Kerman Passey MS, [place], LDN [phone] Pager

## 2015-03-27 NOTE — ED Notes (Signed)
Pt in with co generalized weakness since yest, dx with pneumonia last month.  Pt denies any pain, or shob, no distress noted.

## 2015-03-27 NOTE — Care Management (Signed)
Patient was on thicken liquids last admission and family has requested same for this admission.  Diet order is nectar thick and nursing will follow swallowing and obtain slp consult if appears to be having issues with swallowing.  Patient was evaluated by slp and physical therapy last admission.   Mrs Moure says that patient has been falling a lot at home for the past couple of days.  Over the last few days he has been confused.  Prior to this patient was independent in ambulation, no falls, able to perform bath and mentally alert.  Today's presentation is a marked decline in status.  There are no issues accessing medical care, obtaining meds or transportation.  Ms Townley states that unless patient "gets better and walk without falling", it will be difficult for her to manage him at home.  There is a physical therapy consult pending.  At last discharge when evaluated by physical therapy, there were no physical recommendations

## 2015-03-27 NOTE — Progress Notes (Signed)
Patient admitted today from home with his wife. No complaints of pain. On room air. Blood pressure is now stable after taking scheduled meds. Sinus rhythm on tele. Fluids running and IV antibiotics given. Skin assessment verified with Prince Solian, RN. Patient oriented to the room and call bell is within reach. Will continue to monitor.

## 2015-03-27 NOTE — ED Provider Notes (Signed)
Time Seen: Approximately *----------------------------------------- 8:47 AM on 03/27/2015 -----------------------------------------   I have reviewed the triage notes  Chief Complaint: Weakness   History of Present Illness: Phillip [name]. is a 81 y.o. male who presents with some generalized weakness that he describes as starting yesterday. He denies any focal deficits. He denies any trouble with speech or swallowing. Simply states that he had fatigue and could not get out of bed. He normally functions independently is and is able to ambulate. The patient denies any chest pain, shortness of breath, productive cough or wheezing, abdominal pain, etc. He is not aware of any fever, chills, dysuria, hematuria. He denies any current nausea or vomiting. Patient was admitted here recently for community-acquired pneumonia. He arrives hypertensive but has not had his normal morning medications.   Past Medical History  Diagnosis Date  . Hypertension   . Benign enlargement of prostate   . Kidney stone   . Coronary artery disease   . Dysphagia     Patient Active Problem List   Diagnosis Date Noted  . Gastroenteritis 02/23/2015  . Nausea with vomiting 02/23/2015  . Pneumonia 02/21/2015    Past Surgical History  Procedure Laterality Date  . Appendectomy    . Cataract extraction      Past Surgical History  Procedure Laterality Date  . Appendectomy    . Cataract extraction      Current Outpatient Rx  Name  Route  Sig  Dispense  Refill  . acetaminophen (TYLENOL) 325 MG tablet   Oral   Take 325-650 mg by mouth 3 (three) times daily.         Marland Kitchen alfuzosin (UROXATRAL) 10 MG 24 hr tablet   Oral   Take 10 mg by mouth at bedtime.         . cholecalciferol (VITAMIN D) 1000 UNITS tablet   Oral   Take 1,000 Units by mouth 2 (two) times daily.         . cloNIDine (CATAPRES) 0.2 MG tablet   Oral   Take 0.2 mg by mouth 2 (two) times daily.         . clopidogrel (PLAVIX)  75 MG tablet   Oral   Take 75 mg by mouth daily.         Marland Kitchen guaiFENesin-dextromethorphan (ROBITUSSIN DM) 100-10 MG/5ML syrup   Oral   Take 5 mLs by mouth every 4 (four) hours as needed for cough.   118 mL   0   . hydrALAZINE (APRESOLINE) 50 MG tablet   Oral   Take 50 mg by mouth 3 (three) times daily.         . hydrochlorothiazide (HYDRODIURIL) 25 MG tablet   Oral   Take 25 mg by mouth daily.         . isosorbide dinitrate (ISORDIL) 30 MG tablet   Oral   Take 30 mg by mouth daily.         . metoprolol (LOPRESSOR) 50 MG tablet   Oral   Take 50 mg by mouth 2 (two) times daily.         Marland Kitchen moxifloxacin (AVELOX) 400 MG tablet   Oral   Take 1 tablet (400 mg total) by mouth daily at 8 pm.   7 tablet   0   . Saline 0.9 % AERS   Nasal   Place 1 spray into the nose 4 (four) times daily.         . simvastatin (ZOCOR) 40 MG  tablet   Oral   Take 40 mg by mouth at bedtime.           Allergies:  Review of patient's allergies indicates no known allergies.  Family History: Family History  Problem Relation Age of Onset  . Lung cancer Father     Social History: Social History  Substance Use Topics  . Smoking status: Former Research scientist (life sciences)  . Smokeless tobacco: Not on file  . Alcohol Use: No     Review of Systems:   10 point review of systems was performed and was otherwise negative:  Constitutional: No fever Eyes: No visual disturbances ENT: No sore throat, ear pain Cardiac: No chest pain Respiratory: No shortness of breath, wheezing, or stridor Abdomen: No abdominal pain, no vomiting, No diarrhea Endocrine: No weight loss, No night sweats Extremities: No peripheral edema, cyanosis Skin: No rashes, easy bruising Neurologic: No focal weakness, trouble with speech or swollowing Urologic: No dysuria, Hematuria, or urinary frequency  Physical Exam:  ED Triage Vitals  Enc Vitals Group     BP 03/27/15 0556 199/93 mmHg     Pulse Rate 03/27/15 0556 83      Resp 03/27/15 0556 18     Temp 03/27/15 0556 98.3 F (36.8 C)     Temp Source 03/27/15 0556 Oral     SpO2 03/27/15 0556 96 %     Weight 03/27/15 0556 200 lb (90.719 kg)     Height 03/27/15 0556 6\' 3"  (1.905 m)     Head Cir --      Peak Flow --      Pain Score --      Pain Loc --      Pain Edu? --      Excl. in [place]? --     General: Awake , Alert , and Oriented times 3; GCS 15 Head: Normal cephalic , atraumatic Eyes: Pupils equal , round, reactive to light Nose/Throat: No nasal drainage, patent upper airway without erythema or exudate.  Neck: Supple, Full range of motion, No anterior adenopathy or palpable thyroid masses Lungs: Clear to ascultation without wheezes , rhonchi, or rales Heart: Regular rate, regular rhythm without murmurs , gallops , or rubs Abdomen: Soft, non tender without rebound, guarding , or rigidity; bowel sounds positive and symmetric in all 4 quadrants. No organomegaly .        Extremities: 2 plus symmetric pulses. No edema, clubbing or cyanosis Neurologic: , motor symmetric without any focal deficits, sensory is intact. No cerebellar signs Skin: warm, dry, no rashes   Labs:   All laboratory work was reviewed including any pertinent negatives or positives listed below:  Labs Reviewed  [place] - Abnormal; Notable for the following:    Potassium 3.2 (*)    Glucose, Bld 122 (*)    GFR calc non Af Amer 58 (*)    All other components within normal limits  CBC - Abnormal; Notable for the following:    WBC 14.6 (*)    Hemoglobin 12.8 (*)    HCT 39.6 (*)    RDW 15.9 (*)    Platelets 100 (*)    All other components within normal limits  TROPONIN I - Abnormal; Notable for the following:    Troponin I 0.11 (*)    All other components within normal limits  CULTURE, BLOOD (ROUTINE X 2)  CULTURE, BLOOD (ROUTINE X 2)  LACTIC ACID, PLASMA  URINALYSIS COMPLETEWITH MICROSCOPIC ([facility] ONLY)  LACTIC ACID, PLASMA   patient  has some laboratory work  pending which includes a urinalysis along with blood cultures 2 patient was found to have an elevated troponin with last normal levels back on October 6 that being 0.03  EKG: * ED ECG REPORT I, Daymon Larsen, the attending physician, personally viewed and interpreted this ECG.  Date: 03/27/2015 EKG Time: 602 Rate: 91 Rhythm: normal sinus rhythm occasional PVCs QRS Axis: normal Intervals: normal ST/T Wave abnormalities: normal Conduction Disutrbances: none Narrative Interpretation: unremarkable No significant change in comparison to 02-21-2015   Radiology:  CHEST 2 VIEW  COMPARISON: 02/22/2014  FINDINGS: Indistinct opacity at the left base without volume loss or air bronchogram. Based on the lateral projection, this is favored to reflect atelectasis. No edema, effusion, or pneumothorax. Chronic mild cardiomegaly and aortic tortuosity. Flowing osteophytes with diffuse spinal ankylosis.  IMPRESSION: 1. Patchy opacity at the left base, favor atelectasis over bronchopneumonia. Followup PA and lateral chest X-ray is recommended in 3-4 weeks to ensure resolution. 2. Spondyloarthropathy with diffuse spinal ankylosis.    I personally reviewed the radiologic studies      ED Course:  Patient does not have any findings consistent with a acute cerebral vascular accident this time though of concern is an elevated white blood cell count, history of pneumonia, and an elevated troponin level. Patient will be started on broad-spectrum antibiotic therapy and has blood cultures 2 pending and also urinalysis pending at this time. Patient I felt required further inpatient observation. I spoke to the hospitalist team, further disposition and management depends upon their evaluation. She was started on some of his blood pressure medications while here which included the hydralazine and clonidine.   Assessment:  1. Generalized weakness 2. Elevated troponin of unknown significance 3.  Hypertension 4. Elevated white blood cell count of unknown significance  Final Clinical Impression: * Final diagnoses:  Acute weakness     Plan:  Inpatient management            Daymon Larsen, MD 03/27/15 [phone]

## 2015-03-27 NOTE — H&P (Signed)
[facility] NAME: Phillip [name]    MR#:  [id number]  DATE OF BIRTH:  1926-12-05  DATE OF ADMISSION:  03/27/2015  PRIMARY CARE PHYSICIAN: No primary care provider on file.   REQUESTING/REFERRING PHYSICIAN: Dr. Marcelene Butte  CHIEF COMPLAINT:  Generalized weakness  HISTORY OF PRESENT ILLNESS:   Phillip [name]. is a 81 y.o. male with past medical history of essential hypertension, coronary artery disease, BPH and renal stones came into the ED with a chief complaint of generalized weakness. Patient was admitted to the hospital on October 6 and was discharged on October 8 with a diagnosis of pneumonia and was treated with moxifloxacin at that point. Patient temporarily felt better but eventually he was feeling weak and tired and having productive cough. Denies any fever or chest pain but his physical condition is declining and unable to perform his ADLs without any assistance as reported by the family members. Patient came into the ED chest x-ray has revealed left lower lobe infiltrate versus atelectasis. Patient has leukocytosis and has productive cough. Denies any headache or blurry vision. No other complaints.  PAST MEDICAL HISTORY:   Past Medical History  Diagnosis Date  . Hypertension   . Benign enlargement of prostate   . Kidney stone   . Coronary artery disease   . Dysphagia     PAST SURGICAL HISTOIRY:   Past Surgical History  Procedure Laterality Date  . Appendectomy    . Cataract extraction      SOCIAL HISTORY:   Social History  Substance Use Topics  . Smoking status: Former Research scientist (life sciences)  . Smokeless tobacco: Not on file  . Alcohol Use: No    FAMILY HISTORY:   Family History  Problem Relation Age of Onset  . Lung cancer Father     DRUG ALLERGIES:  No Known Allergies  REVIEW OF SYSTEMS:  CONSTITUTIONAL: No fever, reporting fatigue and weakness.  EYES: No blurred or double vision.  EARS, NOSE, AND  THROAT: No tinnitus or ear pain.  RESPIRATORY: Reporting productive cough, shortness of breath with exertion, denies wheezing or hemoptysis.  CARDIOVASCULAR: No chest pain, orthopnea, edema.  GASTROINTESTINAL: No nausea, vomiting, diarrhea or abdominal pain.  GENITOURINARY: No dysuria, hematuria.  ENDOCRINE: No polyuria, nocturia,  HEMATOLOGY: No anemia, easy bruising or bleeding SKIN: No rash or lesion. MUSCULOSKELETAL: No joint pain or arthritis.   NEUROLOGIC: No tingling, numbness, reporting generalized weakness.  PSYCHIATRY: No anxiety or depression.   MEDICATIONS AT HOME:   Prior to Admission medications   Medication Sig Start Date End Date Taking? Authorizing Provider  acetaminophen (TYLENOL) 325 MG tablet Take 325-650 mg by mouth 3 (three) times daily.   Yes Historical Provider, MD  alfuzosin (UROXATRAL) 10 MG 24 hr tablet Take 10 mg by mouth at bedtime.   Yes Historical Provider, MD  cholecalciferol (VITAMIN D) 1000 UNITS tablet Take 1,000 Units by mouth 2 (two) times daily.   Yes Historical Provider, MD  cloNIDine (CATAPRES) 0.2 MG tablet Take 0.2 mg by mouth 2 (two) times daily.   Yes Historical Provider, MD  clopidogrel (PLAVIX) 75 MG tablet Take 75 mg by mouth daily.   Yes Historical Provider, MD  hydrALAZINE (APRESOLINE) 50 MG tablet Take 50 mg by mouth 3 (three) times daily.   Yes Historical Provider, MD  hydrochlorothiazide (HYDRODIURIL) 25 MG tablet Take 25 mg by mouth daily.   Yes Historical Provider, MD  isosorbide dinitrate (ISORDIL) 30 MG tablet Take 30 mg  by mouth daily.   Yes Historical Provider, MD  metoprolol (LOPRESSOR) 50 MG tablet Take 50 mg by mouth 2 (two) times daily.   Yes Historical Provider, MD  Saline 0.9 % AERS Place 1 spray into the nose 4 (four) times daily.   Yes Historical Provider, MD  simvastatin (ZOCOR) 40 MG tablet Take 40 mg by mouth at bedtime.   Yes Historical Provider, MD      VITAL SIGNS:  Blood pressure 184/98, pulse 83, temperature 98.3  F (36.8 C), temperature source Oral, resp. rate 22, height 6\' 3"  (1.905 m), weight 95.255 kg (210 lb), SpO2 98 %.  PHYSICAL EXAMINATION:  GENERAL:  81 y.o.-year-old patient lying in the bed with no acute distress.  EYES: Pupils equal, round, reactive to light and accommodation. No scleral icterus. Extraocular muscles intact.  HEENT: Head atraumatic, normocephalic. Oropharynx and nasopharynx clear.  NECK:  Supple, no jugular venous distention. No thyroid enlargement, no tenderness.  LUNGS: Moderate air entry , decreased breath sounds at left lower lung , no wheezing, rales,rhonchi or crepitation. No use of accessory muscles of respiration.  CARDIOVASCULAR: S1, S2 normal. No murmurs, rubs, or gallops.  ABDOMEN: Soft, nontender, nondistended. Bowel sounds present. No organomegaly or mass.  EXTREMITIES: No pedal edema, cyanosis, or clubbing.  NEUROLOGIC: Cranial nerves II through XII are intact. Muscle strength 3-4/5 in all extremities. Sensation intact. Gait not checked.  PSYCHIATRIC: The patient is alert and oriented x 3.  SKIN: No obvious rash, lesion, or ulcer.   LABORATORY PANEL:   CBC  Recent Labs Lab 03/27/15 0602  WBC 14.6*  HGB 12.8*  HCT 39.6*  PLT 100*   ------------------------------------------------------------------------------------------------------------------  Chemistries   Recent Labs Lab 03/27/15 0602  NA 139  K 3.2*  CL 104  CO2 27  GLUCOSE 122*  BUN 15  CREATININE 1.11  CALCIUM 9.3   ------------------------------------------------------------------------------------------------------------------  Cardiac Enzymes  Recent Labs Lab 03/27/15 0602  TROPONINI 0.11*   ------------------------------------------------------------------------------------------------------------------  RADIOLOGY:  Dg Chest 2 View  03/27/2015  CLINICAL DATA:  Weakness EXAM: CHEST  2 VIEW COMPARISON:  02/22/2014 FINDINGS: Indistinct opacity at the left base without  volume loss or air bronchogram. Based on the lateral projection, this is favored to reflect atelectasis. No edema, effusion, or pneumothorax. Chronic mild cardiomegaly and aortic tortuosity. Flowing osteophytes with diffuse spinal ankylosis. IMPRESSION: 1. Patchy opacity at the left base, favor atelectasis over bronchopneumonia. Followup PA and lateral chest X-ray is recommended in 3-4 weeks to ensure resolution. 2. Spondyloarthropathy with diffuse spinal ankylosis. Electronically Signed   By: Monte Fantasia M.D.   On: 03/27/2015 06:40    EKG:   Orders placed or performed during the hospital encounter of 03/27/15  . ED EKG  . ED EKG    IMPRESSION AND PLAN:  Phillip [name]. is a 81 y.o. male with past medical history of essential hypertension, coronary artery disease, BPH and renal stones came into the ED with a chief complaint of generalized weakness. Patient was admitted to the hospital on October 6 and was discharged on October 8 with a diagnosis of pneumonia and was treated with moxifloxacin at that point. Patient temporarily felt better but eventually he was feeling weak and tired and having productive cough.  1. Gen weakness 2/2 HCAP  Blood cultures 2 and sputum culture and sensitivity were ordered Patient was given first dose of Zosyn in the ED will continue Zosyn and add vancomycin to the regimen. Provide nebulizer treatments as needed basis for shortness of breath.  Antitussive with Robitussin-DM  2. Malignant hypertension One dose of clonidine was given in the ED. Patient did not his a.m. dose of antihypertensive medications We will resume his home medications clonidine, hydralazine and metoprolol and uptitrate as needed basis.  3. Elevated troponin can be from demand ischemia/malignant hypertension Patient denies any chest pain and no EKG changes were revealed Will trend cardiac biomarkers Patient will be on aspirin 325 mg Continue his home medication metoprolol and  statin. Monitor him on telemetry.  4 generalized weakness could be from problem #1   PT evaluation is ordered       All the records are reviewed and case discussed with ED provider. Management plans discussed with the patient, family and they are in agreement.  CODE STATUS: FC/ wife is HCPOA  TOTAL TIME TAKING CARE OF THIS PATIENT: 50  minutes.    Phillip [name] M.D on 03/27/2015 at 9:41 AM  Between 7am to 6pm - Pager - [phone]  After 6pm go to [email or web address] - password [email or web address] [facility]  [facility]  Office  [phone]  CC: Primary care physician; No primary care provider on file.

## 2015-03-28 ENCOUNTER — Inpatient Hospital Stay: Payer: Medicare Other

## 2015-03-28 LAB — CBC
HCT: 34.1 % — ABNORMAL LOW (ref 40.0–52.0)
Hemoglobin: 11.1 g/dL — ABNORMAL LOW (ref 13.0–18.0)
MCH: 28.8 pg (ref 26.0–34.0)
MCHC: 32.5 g/dL (ref 32.0–36.0)
MCV: 88.7 fL (ref 80.0–100.0)
PLATELETS: 85 10*3/uL — AB (ref 150–440)
RBC: 3.85 MIL/uL — ABNORMAL LOW (ref 4.40–5.90)
RDW: 16 % — AB (ref 11.5–14.5)
WBC: 9.1 10*3/uL (ref 3.8–10.6)

## 2015-03-28 LAB — BASIC METABOLIC PANEL
Anion gap: 6 (ref 5–15)
BUN: 17 mg/dL (ref 6–20)
CALCIUM: 8.4 mg/dL — AB (ref 8.9–10.3)
CHLORIDE: 104 mmol/L (ref 101–111)
CO2: 28 mmol/L (ref 22–32)
CREATININE: 1.26 mg/dL — AB (ref 0.61–1.24)
GFR calc Af Amer: 58 mL/min — ABNORMAL LOW (ref >60)
GFR calc non Af Amer: 50 mL/min — ABNORMAL LOW (ref >60)
Glucose, Bld: 128 mg/dL — ABNORMAL HIGH (ref 65–99)
Potassium: 3.5 mmol/L (ref 3.5–5.1)
SODIUM: 138 mmol/L (ref 135–145)

## 2015-03-28 LAB — URINE CULTURE

## 2015-03-28 LAB — TROPONIN I
Troponin I: 0.03 ng/mL (ref <0.031)
Troponin I: 0.03 ng/mL (ref <0.031)

## 2015-03-28 LAB — TSH: TSH: 1.371 u[IU]/mL (ref 0.350–4.500)

## 2015-03-28 LAB — MAGNESIUM: MAGNESIUM: 1.7 mg/dL (ref 1.7–2.4)

## 2015-03-28 MED ORDER — HYDRALAZINE HCL 50 MG PO TABS
75.0000 mg | ORAL_TABLET | Freq: Three times a day (TID) | ORAL | Status: DC
  Administered 2015-03-28 – 2015-03-29 (×2): 75 mg via ORAL
  Filled 2015-03-28 (×2): qty 1

## 2015-03-28 MED ORDER — HYDRALAZINE HCL 50 MG PO TABS
75.0000 mg | ORAL_TABLET | Freq: Once | ORAL | Status: AC
  Administered 2015-03-28: 75 mg via ORAL
  Filled 2015-03-28: qty 1

## 2015-03-28 MED ORDER — METOPROLOL TARTRATE 1 MG/ML IV SOLN: 5.0000 mg | INTRAVENOUS | Status: DC | PRN

## 2015-03-28 NOTE — Evaluation (Signed)
Physical Therapy Evaluation Patient Details Name: Phillip [name]. MRN: [id number] DOB: 1927-02-09 Today's Date: 03/28/2015   History of Present Illness  Patient presents with generalized weakness and found to have LLL infiltrate vs atelectasis. Patient has been noted to be falling frequently at home since previous admission roughly 1 month ago. Pt states he has not been able to stand up in 2 days.   Clinical Impression  Patient was recently admitted to this hospital and found to ambulate household distances and stairs with no assistive device and supervision level assistance. During this evaluation patient requires max A x1 to stand, secondary to closed chain functional LE weakness. Per the patient he has been experiencing trouble standing for several days, with no clear precipitating event. Patient is able to ambulate with RW and not lose balance for household distances, however given his significant dependence with transfers and bed mobility patient would benefit from short term rehabilitation to increase his independence with mobility.     Follow Up Recommendations SNF    Equipment Recommendations       Recommendations for Other Services       Precautions / Restrictions Precautions Precautions: Fall Restrictions Weight Bearing Restrictions: No      Mobility  Bed Mobility Overal bed mobility: Needs Assistance Bed Mobility: Supine to Sit     Supine to sit: Min assist;HOB elevated     General bed mobility comments: Patient required cuing for seuqencing and hand placement on bed rails.   Transfers Overall transfer level: Needs assistance Equipment used: Rolling walker (2 wheeled) Transfers: Sit to/from Stand Sit to Stand: Max assist         General transfer comment: Patient required cuing and heavy assistance for transfer, maintained flexed knees in standing initially until cued otherwise.   Ambulation/Gait Ambulation/Gait assistance: Min guard Ambulation  Distance (Feet): 100 Feet Assistive device: Rolling walker (2 wheeled) Gait Pattern/deviations: Step-through pattern;Decreased dorsiflexion - left;Decreased dorsiflexion - right;Trunk flexed;Wide base of support   Gait velocity interpretation: Below normal speed for age/gender General Gait Details: Patient at times has his feet outside of the RW. No loss of balance and reasonable gait speed observed during this session. His knees remain somewhat flexed during gait though no buckling observed.   Stairs            Wheelchair Mobility    Modified Rankin (Stroke Patients Only)       Balance Overall balance assessment: Needs assistance Sitting-balance support: No upper extremity supported Sitting balance-Leahy Scale: Good Sitting balance - Comments: Initially patient leans posteriorly and to the R, though he is able to maintain in neutral once assisted by PT.    Standing balance support: Bilateral upper extremity supported Standing balance-Leahy Scale: Fair Standing balance comment: Once in standing patient has no loss of balance, dynamic balance not formally assessed. No buckling observed or lateral deviations.                              Pertinent Vitals/Pain Pain Assessment: No/denies pain    Home Living Family/patient expects to be discharged to:: Skilled nursing facility Living Arrangements: Spouse/significant other Available Help at Discharge: Family Type of Home: House Home Access: Stairs to enter Entrance Stairs-Rails:  (Was not able to determine) Entrance Stairs-Number of Steps: 4   Home Equipment: Cane - single point;Walker - 2 wheels Additional Comments: Pt reports that he almost never uses the walker, but does use a cane some times.  Prior Function Level of Independence: Independent         Comments: Patient reports he has not been able to stand x 2 days prior to this admission. States he was not using cane/walker prior to this onset of  weakness.     Hand Dominance        Extremity/Trunk Assessment   Upper Extremity Assessment: Overall WFL for tasks assessed           Lower Extremity Assessment: Overall WFL for tasks assessed         Communication   Communication: No difficulties  Cognition Arousal/Alertness: Awake/alert Behavior During Therapy: WFL for tasks assessed/performed;Flat affect Overall Cognitive Status: Within Functional Limits for tasks assessed                      General Comments      Exercises        Assessment/Plan    PT Assessment Patient needs continued PT services  PT Diagnosis Difficulty walking;Generalized weakness   PT Problem List Decreased strength;Decreased mobility;Decreased balance;Decreased knowledge of use of DME  PT Treatment Interventions DME instruction;Therapeutic activities;Therapeutic exercise;Gait training;Balance training;Stair training   PT Goals (Current goals can be found in the Care Plan section) Acute Rehab PT Goals Patient Stated Goal: To get stronger and be able to stand up.  PT Goal Formulation: With patient Time For Goal Achievement: 04/11/15 Potential to Achieve Goals: Good    Frequency Min 2X/week   Barriers to discharge Inaccessible home environment;Decreased caregiver support Patient requires heavy assistance for sit to stand transfer and would have to ascend 4 steps to enter the home.     Co-evaluation               End of Session Equipment Utilized During Treatment: Gait belt Activity Tolerance: Patient tolerated treatment well Patient left: in chair;with chair alarm set;with call bell/phone within reach Nurse Communication: Mobility status         Time: [id number] PT Time Calculation (min) (ACUTE ONLY): 29 min   Charges:   PT Evaluation $Initial PT Evaluation Tier I: 1 Procedure PT Treatments $Therapeutic Activity: 8-22 mins (Used urinal, clean up, gown change)   PT G Codes:       Kerman Passey, PT,  DPT    03/28/2015, 2:20 PM

## 2015-03-28 NOTE — Progress Notes (Signed)
Speech Therapy Note: received order; reviewed chart notes and consulted NSG/pt/family re: pt's status. Family fully state that pt is at his baseline w/ his swallowing of his baseline Dysphagia diet including Nectar consistency liquids. Today, pt had 2 larger pills which he was unable to swallow easily d/t the size. This was concerning to [place] and pt today. Upon discussing this further, pt/family stated he could take his "regular" pills (of smaller sizes w/ puree) w/out difficulty, and he was tolerating his baseline diet appropriately. Discussed crushing larger pills; pt/family agreed. ST will f/u w/ this plan and toleration of diet w/ aspiration precautions tomorrow w/ BSE if indicated. Pt/family agreed; NSG; agreed.

## 2015-03-28 NOTE — Progress Notes (Signed)
03/28/15 0900  Clinical Encounter Type  Visited With Patient  Visit Type Initial  Referral From Nurse  Consult/Referral To Chaplain  Spiritual Encounters  Spiritual Needs Literature  Stress Factors  Patient Stress Factors Exhausted;Health changes  Advance Directives (For Healthcare)  Does patient have an advance directive? No  Would patient like information on creating an advanced directive? Yes - Scientist, clinical (histocompatibility and immunogenetics) given  Met w/patient and provided education for AD. Patient was not interested in pursuing completing the documents at this time. Patient was advised to contact chaplain if he decides to later pursue completing these documents.  Chap. Yarely Bebee G. Cheraw

## 2015-03-28 NOTE — Care Management (Signed)
Patient is awake and alert today with is an improvement since yesterday.  He confirms his wife's story that he has become so weak over the last few days that he could not stand.  Slid of his bed x 2 just prior to admission and could not " get myself up."  White count has improved.  Patient's mentation greatly improved today

## 2015-03-28 NOTE — Progress Notes (Signed)
[facility] [place] NAME: Phillip [name]    MR#:  [id number]  DATE OF BIRTH:  21-Aug-1926  SUBJECTIVE:  CHIEF COMPLAINT:  Patient is out of bed to chair. Feeling better but aspirating while eating as reported by the nurse. Patient is kept nothing by mouth  REVIEW OF SYSTEMS:  CONSTITUTIONAL: No fever, fatigue or weakness.  EYES: No blurred or double vision.  EARS, NOSE, AND THROAT: No tinnitus or ear pain.  RESPIRATORY:  cough and throat clearing while eating., Improving shortness of breath, no wheezing or hemoptysis.  CARDIOVASCULAR: No chest pain, orthopnea, edema.  GASTROINTESTINAL: No nausea, vomiting, diarrhea or abdominal pain.  GENITOURINARY: No dysuria, hematuria.  ENDOCRINE: No polyuria, nocturia,  HEMATOLOGY: No anemia, easy bruising or bleeding SKIN: No rash or lesion. MUSCULOSKELETAL: No joint pain or arthritis.   NEUROLOGIC: No tingling, numbness, weakness.  PSYCHIATRY: No anxiety or depression.   DRUG ALLERGIES:  No Known Allergies  VITALS:  Blood pressure 164/83, pulse 74, temperature 97.6 F (36.4 C), temperature source Oral, resp. rate 14, height 6\' 3"  (1.905 m), weight 94.121 kg (207 lb 8 oz), SpO2 100 %.  PHYSICAL EXAMINATION:  GENERAL:  81 y.o.-year-old patient lying in the bed with no acute distress.  EYES: Pupils equal, round, reactive to light and accommodation. No scleral icterus. Extraocular muscles intact.  HEENT: Head atraumatic, normocephalic. Oropharynx and nasopharynx clear.  NECK:  Supple, no jugular venous distention. No thyroid enlargement, no tenderness.  LUNGS: Normal breath sounds bilaterally, no wheezing, rales,rhonchi or crepitation. No use of accessory muscles of respiration.  CARDIOVASCULAR: S1, S2 normal. No murmurs, rubs, or gallops.  ABDOMEN: Soft, nontender, nondistended. Bowel sounds present. No organomegaly or mass.  EXTREMITIES: No pedal edema, cyanosis, or clubbing.   NEUROLOGIC: Cranial nerves II through XII are intact. Muscle strength 5/5 in all extremities. Sensation intact. Gait not checked.  PSYCHIATRIC: The patient is alert and oriented x 3.  SKIN: No obvious rash, lesion, or ulcer.    LABORATORY PANEL:   CBC  Recent Labs Lab 03/28/15 0439  WBC 9.1  HGB 11.1*  HCT 34.1*  PLT 85*   ------------------------------------------------------------------------------------------------------------------  Chemistries   Recent Labs Lab 03/28/15 0439  NA 138  K 3.5  CL 104  CO2 28  GLUCOSE 128*  BUN 17  CREATININE 1.26*  CALCIUM 8.4*  MG 1.7   ------------------------------------------------------------------------------------------------------------------  Cardiac Enzymes  Recent Labs Lab 03/28/15 1541  TROPONINI 0.03   ------------------------------------------------------------------------------------------------------------------  RADIOLOGY:  X-ray Chest Pa And Lateral  03/28/2015  CLINICAL DATA:  Shortness of breath, generalized weakness, productive cough EXAM: CHEST  2 VIEW COMPARISON:  03/27/2015 FINDINGS: Mild left basilar opacity, atelectasis versus pneumonia. No pleural effusion or pneumothorax. Cardiomegaly. Degenerative changes of the visualized thoracolumbar spine. IMPRESSION: Mild left basilar opacity, atelectasis versus pneumonia. Electronically Signed   By: Julian Hy M.D.   On: 03/28/2015 11:30   Dg Chest 2 View  03/27/2015  CLINICAL DATA:  Weakness EXAM: CHEST  2 VIEW COMPARISON:  02/22/2014 FINDINGS: Indistinct opacity at the left base without volume loss or air bronchogram. Based on the lateral projection, this is favored to reflect atelectasis. No edema, effusion, or pneumothorax. Chronic mild cardiomegaly and aortic tortuosity. Flowing osteophytes with diffuse spinal ankylosis. IMPRESSION: 1. Patchy opacity at the left base, favor atelectasis over bronchopneumonia. Followup PA and lateral chest X-ray is  recommended in 3-4 weeks to ensure resolution. 2. Spondyloarthropathy with diffuse spinal ankylosis. Electronically Signed   By: Angelica Chessman  Watts M.D.   On: 03/27/2015 06:40    EKG:   Orders placed or performed during the hospital encounter of 03/27/15  . ED EKG  . ED EKG    ASSESSMENT AND PLAN:   Phillip Shaak. is a 81 y.o. male with past medical history of essential hypertension, coronary artery disease, BPH and renal stones came into the ED with a chief complaint of generalized weakness. Patient was admitted to the hospital on October 6 and was discharged on October 8 with a diagnosis of pneumonia and was treated with moxifloxacin at that point. Patient temporarily felt better but eventually he was feeling weak and tired and having productive cough.  1. Gen weakness 2/2 HCAP  Blood cultures 2 and sputum culture and sensitivity were ordered Continue Zosyn and vancomycin Provide nebulizer treatments as needed basis for shortness of breath. Antitussive with Robitussin-DM  2. Malignant hypertension-improving clinically One dose of clonidine was given in the ED. Patient did not his a.m. dose of antihypertensive medications Continue his home medications clonidine, hydralazine and metoprolol and uptitrate as needed basis.  3. Elevated troponin can be from demand ischemia/malignant hypertension Patient denies any chest pain and no EKG changes were revealed Nontending  cardiac biomarkers Patient will be on aspirin 325 mg Continue his home medication metoprolol and statin after speech evaluation Monitor him on telemetry.  4 generalized weakness could be from problem #1  PT evaluation is ordered   5. Possible aspiration Nothing by mouth Speech therapy consult is placed       All the records are reviewed and case discussed with Care Management/Social Workerr. Management plans discussed with the patient, family and they are in agreement.  CODE STATUS: Full code  TOTAL  TIME TAKING CARE OF THIS PATIENT: 35 minutes.   POSSIBLE D/C IN 2-3 DAYS, DEPENDING ON CLINICAL CONDITION.   Nicholes Mango M.D on 03/28/2015 at 4:52 PM  Between 7am to 6pm - Pager - [phone] After 6pm go to [email or web address] - [email or web address] [facility]  [facility]  Office  [phone]  CC: Primary care physician; No primary care provider on file.

## 2015-03-28 NOTE — Progress Notes (Signed)
Spoke with Dr. Margaretmary Eddy about giving 50mg  of apresoline now because order was d/c'd. Stated to give 75mg  now and start next dose at 2200 based on bp taken now, 169/84. Wilnette Kales

## 2015-03-28 NOTE — Progress Notes (Signed)
Patient alert and oriented x4, no complaints at this time. vss at this time. Patient Sr with PVC and PAC on telemetry. Will continue to assess. Phillip [name]

## 2015-03-29 LAB — CREATININE, SERUM: Creatinine, Ser: 1.07 mg/dL (ref 0.61–1.24)

## 2015-03-29 MED ORDER — CEFDINIR 300 MG PO CAPS
300.0000 mg | ORAL_CAPSULE | Freq: Two times a day (BID) | ORAL | Status: DC
Start: 2015-03-29 — End: 2015-09-13

## 2015-03-29 MED ORDER — HYDRALAZINE HCL 50 MG PO TABS: 100.0000 mg | ORAL_TABLET | Freq: Three times a day (TID) | ORAL | Status: DC

## 2015-03-29 MED ORDER — HYDRALAZINE HCL 25 MG PO TABS: 75.0000 mg | ORAL_TABLET | Freq: Three times a day (TID) | ORAL | Status: DC

## 2015-03-29 MED ORDER — HYDRALAZINE HCL 50 MG PO TABS: 75.0000 mg | ORAL_TABLET | Freq: Three times a day (TID) | ORAL | Status: DC

## 2015-03-29 MED ORDER — GUAIFENESIN-DM 100-10 MG/5ML PO SYRP: 5.0000 mL | ORAL_SOLUTION | Freq: Four times a day (QID) | ORAL | Status: DC | PRN

## 2015-03-29 MED ORDER — ALBUTEROL SULFATE HFA 108 (90 BASE) MCG/ACT IN AERS: 2.0000 | INHALATION_SPRAY | Freq: Four times a day (QID) | RESPIRATORY_TRACT | Status: DC | PRN

## 2015-03-29 MED ORDER — DOCUSATE SODIUM 100 MG PO CAPS: 100.0000 mg | ORAL_CAPSULE | Freq: Two times a day (BID) | ORAL | Status: DC

## 2015-03-29 MED ORDER — HYDROCHLOROTHIAZIDE 25 MG PO TABS: 12.5000 mg | ORAL_TABLET | Freq: Every day | ORAL | Status: DC

## 2015-03-29 NOTE — Progress Notes (Addendum)
NSR. Room air. Takes meds whole with apple sauce. Urinal. Family updated with plan. Report called to [place] at [place]. A & O. HOH. Discharge instructions in packet. Pt has no further concerns at this time.

## 2015-03-29 NOTE — Discharge Summary (Signed)
[facility] [place] NAME: Phillip [name]    MR#:  [id number]  DATE OF BIRTH:  1927/05/06  DATE OF ADMISSION:  03/27/2015 ADMITTING PHYSICIAN: Phillip Mango, MD  DATE OF DISCHARGE: 03/29/2015 PRIMARY CARE PHYSICIAN: No primary care provider on file.    ADMISSION DIAGNOSIS:  Acute weakness [R53.1]  DISCHARGE DIAGNOSIS:  Active Problems:   HCAP (healthcare-associated pneumonia)  Escherichia coli bacteremia  SECONDARY DIAGNOSIS:   Past Medical History  Diagnosis Date  . Hypertension   . Benign enlargement of prostate   . Kidney stone   . Coronary artery disease   . Dysphagia     HOSPITAL COURSE:   Phillip [name]. is a 81 y.o. male with past medical history of essential hypertension, coronary artery disease, BPH and renal stones came into the ED with a chief complaint of generalized weakness. Patient was admitted to the hospital on October 6 and was discharged on October 8 with a diagnosis of pneumonia and was treated with moxifloxacin at that point. Patient temporarily felt better but eventually he was feeling weak and tired and having productive cough.  1. Gen weakness 2/2 HCAP and Escherichia coli bacteremia Blood cultures revealed Escherichia coli sensitive to Rocephin and Zosyn.  Clinically improved with Zosyn and vancomycin. Will discharge patient with by mouth Omnicef for 12 more days to provide a total of 14 days of antibiotics Provide nebulizer treatments as needed basis for shortness of breath. Antitussive with Robitussin-DM  2. Malignant hypertension-improving clinically One dose of clonidine was given in the ED. Patient did not his a.m. dose of antihypertensive medications Continue his home medications clonidine, hydralazine and metoprolol and uptitrate as needed basis. Hydralazine dose is increased to 75 mg by mouth every 8 hours Hydrochlorothiazide is reduced to 12.5 mg to take by mouth once daily  3.  Elevated troponin can be from demand ischemia/malignant hypertension Patient denies any chest pain and no EKG changes were revealed Nontending cardiac biomarkers Patient will be on aspirin 325 mg, beta blocker and statin  4 generalized weakness could be from problem #1  PT has recommended skilled nursing facility  5. Possible aspiration Speech therapy evaluated the patient. No aspiration identified.    DISCHARGE CONDITIONS:   fair CONSULTS OBTAINED:  Treatment Team:  Phillip Mango, MD   PROCEDURES none  DRUG ALLERGIES:  No Known Allergies  DISCHARGE MEDICATIONS:   Current Discharge Medication List    START taking these medications   Details  albuterol (PROVENTIL HFA;VENTOLIN HFA) 108 (90 BASE) MCG/ACT inhaler Inhale 2 puffs into the lungs every 6 (six) hours as needed for wheezing or shortness of breath. Qty: 1 Inhaler, Refills: 2    cefdinir (OMNICEF) 300 MG capsule Take 1 capsule (300 mg total) by mouth 2 (two) times daily. Qty: 24 capsule, Refills: 0    docusate sodium (COLACE) 100 MG capsule Take 1 capsule (100 mg total) by mouth 2 (two) times daily. Qty: 10 capsule, Refills: 0    guaiFENesin-dextromethorphan (ROBITUSSIN DM) 100-10 MG/5ML syrup Take 5 mLs by mouth every 6 (six) hours as needed for cough. Qty: 118 mL, Refills: 0      CONTINUE these medications which have CHANGED   Details  hydrALAZINE (APRESOLINE) 25 MG tablet Take 3 tablets (75 mg total) by mouth 3 (three) times daily. Qty: 60 tablet, Refills: 0    hydrochlorothiazide (HYDRODIURIL) 25 MG tablet Take 0.5 tablets (12.5 mg total) by mouth daily. Qty: 30 tablet, Refills: 0  CONTINUE these medications which have NOT CHANGED   Details  alfuzosin (UROXATRAL) 10 MG 24 hr tablet Take 10 mg by mouth at bedtime.    cloNIDine (CATAPRES) 0.2 MG tablet Take 0.2 mg by mouth 2 (two) times daily.    clopidogrel (PLAVIX) 75 MG tablet Take 75 mg by mouth daily.    isosorbide dinitrate (ISORDIL) 30  MG tablet Take 30 mg by mouth daily.    metoprolol (LOPRESSOR) 50 MG tablet Take 50 mg by mouth 2 (two) times daily.    simvastatin (ZOCOR) 40 MG tablet Take 40 mg by mouth at bedtime.    acetaminophen (TYLENOL) 325 MG tablet Take 325-650 mg by mouth 3 (three) times daily.      STOP taking these medications     cholecalciferol (VITAMIN D) 1000 UNITS tablet      Saline 0.9 % AERS          DISCHARGE INSTRUCTIONS:   Activity as tolerated, as recommended by physical therapy  Diet low-salt  Follow-up with primary care physician at the facility in 2-5 days or sooner as needed   DIET:  Low fat, Low cholesterol diet  DISCHARGE CONDITION:  Fair  ACTIVITY:  Activity as tolerated, as per physical therapy recommendations  OXYGEN:  Home Oxygen: No.   Oxygen Delivery: room air  DISCHARGE LOCATION:  nursing home   If you experience worsening of your admission symptoms, develop shortness of breath, life threatening emergency, suicidal or homicidal thoughts you must seek medical attention immediately by calling 911 or calling your MD immediately  if symptoms less severe.  You Must read complete instructions/literature along with all the possible adverse reactions/side effects for all the Medicines you take and that have been prescribed to you. Take any new Medicines after you have completely understood and accpet all the possible adverse reactions/side effects.   Please note  You were cared for by a hospitalist during your hospital stay. If you have any questions about your discharge medications or the care you received while you were in the hospital after you are discharged, you can call the unit and asked to speak with the hospitalist on call if the hospitalist that took care of you is not available. Once you are discharged, your primary care physician will handle any further medical issues. Please note that NO REFILLS for any discharge medications will be authorized once you are  discharged, as it is imperative that you return to your primary care physician (or establish a relationship with a primary care physician if you do not have one) for your aftercare needs so that they can reassess your need for medications and monitor your lab values.     Today  Chief Complaint  Patient presents with  . Weakness   Patient is resting comfortably. Feeling better. Denies any chest pain or shortness of breath. Daughter is at bedside  ROS:  CONSTITUTIONAL: Denies fevers, chills. Denies any fatigue, but still feeling weak EYES: Denies blurry vision, double vision, eye pain. EARS, NOSE, THROAT: Denies tinnitus, ear pain, hearing loss. RESPIRATORY: Denies cough, wheeze, shortness of breath.  CARDIOVASCULAR: Denies chest pain, palpitations, edema.  GASTROINTESTINAL: Denies nausea, vomiting, diarrhea, abdominal pain. Denies bright red blood per rectum. GENITOURINARY: Denies dysuria, hematuria. ENDOCRINE: Denies nocturia or thyroid problems. HEMATOLOGIC AND LYMPHATIC: Denies easy bruising or bleeding. SKIN: Denies rash or lesion. MUSCULOSKELETAL: Denies pain in neck, back, shoulder, knees, hips or arthritic symptoms.  NEUROLOGIC: Denies paralysis, paresthesias.  PSYCHIATRIC: Denies anxiety or depressive symptoms.  VITAL SIGNS:  Blood pressure 101/75, pulse 63, temperature 97.9 F (36.6 C), temperature source Oral, resp. rate 18, height 6\' 3"  (1.905 m), weight 95.119 kg (209 lb 11.2 oz), SpO2 96 %.  I/O:   Intake/Output Summary (Last 24 hours) at 03/29/15 1234 Last data filed at 03/29/15 1207  Gross per 24 hour  Intake    473 ml  Output   1200 ml  Net   -727 ml    PHYSICAL EXAMINATION:  GENERAL:  81 y.o.-year-old patient lying in the bed with no acute distress.  EYES: Pupils equal, round, reactive to light and accommodation. No scleral icterus. Extraocular muscles intact.  HEENT: Head atraumatic, normocephalic. Oropharynx and nasopharynx clear.  NECK:  Supple, no  jugular venous distention. No thyroid enlargement, no tenderness.  LUNGS: Normal breath sounds bilaterally, no wheezing, rales,rhonchi or crepitation. No use of accessory muscles of respiration.  CARDIOVASCULAR: S1, S2 normal. No murmurs, rubs, or gallops.  ABDOMEN: Soft, non-tender, non-distended. Bowel sounds present. No organomegaly or mass.  EXTREMITIES: No pedal edema, cyanosis, or clubbing.  NEUROLOGIC: Cranial nerves II through XII are intact. Muscle strength 5/5 in all extremities. Sensation intact. Gait not checked.  PSYCHIATRIC: The patient is alert and oriented x 3.  SKIN: No obvious rash, lesion, or ulcer.   DATA REVIEW:   CBC  Recent Labs Lab 03/28/15 0439  WBC 9.1  HGB 11.1*  HCT 34.1*  PLT 85*    Chemistries   Recent Labs Lab 03/28/15 0439 03/29/15 0439  NA 138  --   K 3.5  --   CL 104  --   CO2 28  --   GLUCOSE 128*  --   BUN 17  --   CREATININE 1.26* 1.07  CALCIUM 8.4*  --   MG 1.7  --     Cardiac Enzymes  Recent Labs Lab 03/28/15 1541  TROPONINI 0.03    Microbiology Results  Results for orders placed or performed during the hospital encounter of 03/27/15  Culture, blood (routine x 2)     Status: None (Preliminary result)   Collection Time: 03/27/15  7:33 AM  Result Value Ref Range Status   Specimen Description BLOOD LEFT ARM  Final   Special Requests BOTTLES DRAWN AEROBIC AND ANAEROBIC  1CC  Final   Culture  Setup Time   Final    GRAM NEGATIVE RODS AEROBIC BOTTLE ONLY CRITICAL RESULT CALLED TO, READ BACK BY AND VERIFIED WITH: [place] AT 2305 03/27/15 CONFIRMED MLZ    Culture ESCHERICHIA COLI AEROBIC BOTTLE ONLY   Final   Report Status PENDING  Incomplete   Organism ID, Bacteria ESCHERICHIA COLI  Final      Susceptibility   Escherichia coli - MIC*    AMPICILLIN >=32 RESISTANT Resistant     CEFTAZIDIME <=1 SENSITIVE Sensitive     CEFAZOLIN 16 INTERMEDIATE Intermediate     CEFTRIAXONE <=1 SENSITIVE Sensitive      CIPROFLOXACIN >=4 RESISTANT Resistant     GENTAMICIN <=1 SENSITIVE Sensitive     IMIPENEM <=0.25 SENSITIVE Sensitive     TRIMETH/SULFA >=320 RESISTANT Resistant     PIP/TAZO Value in next row Sensitive      SENSITIVE<=4    * ESCHERICHIA COLI  Culture, blood (routine x 2)     Status: None (Preliminary result)   Collection Time: 03/27/15  7:41 AM  Result Value Ref Range Status   Specimen Description BLOOD LEFT ARM  Final   Special Requests BOTTLES DRAWN AEROBIC AND ANAEROBIC  [place]  Final   Culture NO GROWTH 2 DAYS  Final   Report Status PENDING  Incomplete  Urine culture     Status: None   Collection Time: 03/27/15  1:47 PM  Result Value Ref Range Status   Specimen Description URINE, RANDOM  Final   Special Requests URINE, RANDOM  Final   Culture INSIGNIFICANT GROWTH  Final   Report Status 03/28/2015 FINAL  Final    RADIOLOGY:  X-ray Chest Pa And Lateral  03/28/2015  CLINICAL DATA:  Shortness of breath, generalized weakness, productive cough EXAM: CHEST  2 VIEW COMPARISON:  03/27/2015 FINDINGS: Mild left basilar opacity, atelectasis versus pneumonia. No pleural effusion or pneumothorax. Cardiomegaly. Degenerative changes of the visualized thoracolumbar spine. IMPRESSION: Mild left basilar opacity, atelectasis versus pneumonia. Electronically Signed   By: Julian Hy M.D.   On: 03/28/2015 11:30   Dg Chest 2 View  03/27/2015  CLINICAL DATA:  Weakness EXAM: CHEST  2 VIEW COMPARISON:  02/22/2014 FINDINGS: Indistinct opacity at the left base without volume loss or air bronchogram. Based on the lateral projection, this is favored to reflect atelectasis. No edema, effusion, or pneumothorax. Chronic mild cardiomegaly and aortic tortuosity. Flowing osteophytes with diffuse spinal ankylosis. IMPRESSION: 1. Patchy opacity at the left base, favor atelectasis over bronchopneumonia. Followup PA and lateral chest X-ray is recommended in 3-4 weeks to ensure resolution. 2. Spondyloarthropathy with  diffuse spinal ankylosis. Electronically Signed   By: Monte Fantasia M.D.   On: 03/27/2015 06:40    EKG:   Orders placed or performed during the hospital encounter of 03/27/15  . ED EKG  . ED EKG      Management plans discussed with the patient, family and they are in agreement.  CODE STATUS:     Code Status Orders        Start     Ordered   03/27/15 1136  Full code   Continuous     03/27/15 1135      TOTAL TIME TAKING CARE OF THIS PATIENT:45 minutes.    @[facility] @  on 03/29/2015 at 12:34 PM  Between 7am to 6pm - Pager - [phone]  After 6pm go to [email or web address] - password [email or web address] [facility]  [facility] [facility]  Office  [phone]  CC: Primary care physician; No primary care provider on file.

## 2015-03-29 NOTE — Care Management Important Message (Signed)
Important Message  Patient Details  Name: Phillip [name]. MRN: [id number] Date of Birth: 01/08/1927   Medicare Important Message Given:  Yes    Katrina Stack, RN 03/29/2015, 9:05 AM

## 2015-03-29 NOTE — NC FL2 (Signed)
[place] LEVEL OF CARE SCREENING TOOL     IDENTIFICATION  Patient Name: Phillip [name]. Birthdate: 05/15/1927 Sex: male Admission Date (Current Location): 03/27/2015  [place] and [place] Number: [facility] and Address:  [facility], [address], [place], [place] [address]      Provider Number: [id number]  Attending Physician Name and Address:  Nicholes Mango, MD  Relative Name and Phone Number:       Current Level of Care: Hospital Recommended Level of Care: [place] Prior Approval Number:    Date Approved/Denied:   PASRR Number:    Discharge Plan: SNF    Current Diagnoses: Patient Active Problem List   Diagnosis Date Noted  . HCAP (healthcare-associated pneumonia) 03/27/2015  . Gastroenteritis 02/23/2015  . Nausea with vomiting 02/23/2015  . Pneumonia 02/21/2015    Orientation ACTIVITIES/SOCIAL BLADDER RESPIRATION    Self, Time, Situation, Place  Family supportive, Active Continent    BEHAVIORAL SYMPTOMS/MOOD NEUROLOGICAL BOWEL NUTRITION STATUS      Continent Diet (DY3 Nectar thick)  PHYSICIAN VISITS COMMUNICATION OF NEEDS Height & Weight Skin  30 days Verbally   209 lbs. Normal          AMBULATORY STATUS RESPIRATION    Assist extensive        Personal Care Assistance Level of Assistance  Bathing, Dressing Bathing Assistance: Maximum assistance   Dressing Assistance: Maximum assistance      Functional Limitations Info                SPECIAL CARE FACTORS FREQUENCY  PT (By licensed PT)     PT Frequency: 5x week             Additional Factors Info                  Current Medications (03/29/2015): Current Facility-Administered Medications  Medication Dose Route Frequency Provider Last Rate Last Dose  . acetaminophen (TYLENOL) tablet 650 mg  650 mg Oral Q6H PRN Nicholes Mango, MD       Or  . acetaminophen (TYLENOL) suppository 650 mg  650 mg Rectal Q6H  PRN Clodagh Odenthal, MD      . albuterol (PROVENTIL) (2.5 MG/3ML) 0.083% nebulizer solution 2.5 mg  2.5 mg Nebulization Q4H PRN Nicholes Mango, MD      . alfuzosin (UROXATRAL) 24 hr tablet 10 mg  10 mg Oral QHS Nicholes Mango, MD   10 mg at 03/28/15 2236  . aspirin EC tablet 325 mg  325 mg Oral Daily Nicholes Mango, MD   325 mg at 03/29/15 0914  . cloNIDine (CATAPRES) tablet 0.2 mg  0.2 mg Oral BID Nicholes Mango, MD   0.2 mg at 03/29/15 0913  . clopidogrel (PLAVIX) tablet 75 mg  75 mg Oral Daily Nicholes Mango, MD   75 mg at 03/29/15 0914  . docusate sodium (COLACE) capsule 100 mg  100 mg Oral BID Nicholes Mango, MD   100 mg at 03/29/15 0914  . enoxaparin (LOVENOX) injection 40 mg  40 mg Subcutaneous Q24H Nicholes Mango, MD   40 mg at 03/28/15 2237  . guaiFENesin-dextromethorphan (ROBITUSSIN DM) 100-10 MG/5ML syrup 5 mL  5 mL Oral Q6H PRN Nicholes Mango, MD      . hydrALAZINE (APRESOLINE) tablet 75 mg  75 mg Oral TID Nicholes Mango, MD   75 mg at 03/29/15 0913  . hydrochlorothiazide (HYDRODIURIL) tablet 25 mg  25 mg Oral Daily Nicholes Mango, MD  25 mg at 03/29/15 0914  . isosorbide dinitrate (ISORDIL) tablet 30 mg  30 mg Oral Daily Nicholes Mango, MD   30 mg at 03/29/15 0913  . metoprolol (LOPRESSOR) injection 5 mg  5 mg Intravenous Q4H PRN Nicholes Mango, MD      . metoprolol (LOPRESSOR) tablet 50 mg  50 mg Oral BID Nicholes Mango, MD   50 mg at 03/29/15 0914  . ondansetron (ZOFRAN) tablet 4 mg  4 mg Oral Q6H PRN Nicholes Mango, MD       Or  . ondansetron (ZOFRAN) injection 4 mg  4 mg Intravenous Q6H PRN Elfreida Heggs, MD      . piperacillin-tazobactam (ZOSYN) IVPB 3.375 g  3.375 g Intravenous 3 times per day Nicholes Mango, MD   3.375 g at 03/29/15 0918  . simvastatin (ZOCOR) tablet 40 mg  40 mg Oral QHS Nicholes Mango, MD   40 mg at 03/28/15 2236  . sodium chloride (OCEAN) 0.65 % nasal spray 1 spray  1 spray Nasal QID Nicholes Mango, MD   1 spray at 03/29/15 1000  . sodium chloride 0.9 % injection 3 mL  3 mL Intravenous Q12H Nicholes Mango, MD   3  mL at 03/29/15 1000  . vancomycin (VANCOCIN) 1,250 mg in sodium chloride 0.9 % 250 mL IVPB  1,250 mg Intravenous Q18H Nicholes Mango, MD   1,250 mg at 03/29/15 0534   Do not use this list as official medication orders. Please verify with discharge summary.  Discharge Medications:   Medication List    ASK your doctor about these medications        alfuzosin 10 MG 24 hr tablet  Commonly known as:  UROXATRAL  Take 10 mg by mouth at bedtime.     cholecalciferol 1000 UNITS tablet  Commonly known as:  VITAMIN D  Take 1,000 Units by mouth 2 (two) times daily.     cloNIDine 0.2 MG tablet  Commonly known as:  CATAPRES  Take 0.2 mg by mouth 2 (two) times daily.     clopidogrel 75 MG tablet  Commonly known as:  PLAVIX  Take 75 mg by mouth daily.     hydrALAZINE 50 MG tablet  Commonly known as:  APRESOLINE  Take 50 mg by mouth 3 (three) times daily.     hydrochlorothiazide 25 MG tablet  Commonly known as:  HYDRODIURIL  Take 25 mg by mouth daily.     isosorbide dinitrate 30 MG tablet  Commonly known as:  ISORDIL  Take 30 mg by mouth daily.     metoprolol 50 MG tablet  Commonly known as:  LOPRESSOR  Take 50 mg by mouth 2 (two) times daily.     Saline 0.9 % Aers  Place 1 spray into the nose 4 (four) times daily.     simvastatin 40 MG tablet  Commonly known as:  ZOCOR  Take 40 mg by mouth at bedtime.     TYLENOL 325 MG tablet  Generic drug:  acetaminophen  Take 325-650 mg by mouth 3 (three) times daily.        Relevant Imaging Results:  Relevant Lab Results:  Recent Labs    Additional [place], LCSW

## 2015-03-29 NOTE — Progress Notes (Addendum)
Speech Therapy Note: reviewed chart notes; consulted NSG and met w/ pt and family again today. Pt is no longer having any issues swallowing pills; meds are being given w/ PUDDING (requested by pt) or applesauce. Pt is tolerating his Dys. 3(mech soft) diet w/ NECTAR liquids following aspiration precautions w/ no reported s/s of aspiration by pt/family or staff.  Rec. Continue w/ current Dys. 3 w/ NECTAR liquids diet d/t baseline dx'd dysphagia; aspiration precautions; meds in Pudding. Feeding assistance/tray setup as nec. Pt does have some requests like OATMEAL w/ butter and sugar at breakfast and no fish at meals. No further skilled ST services indicated at this time. Family education given; all agreed.

## 2015-03-29 NOTE — Care Management (Signed)
For discharge today to skilled nursing 

## 2015-03-29 NOTE — Discharge Instructions (Signed)
Activity as tolerated, as recommended by physical therapy Diet low-salt Follow-up with primary care physician at the facility in 2-5 days or sooner as needed

## 2015-03-30 NOTE — Clinical Social Work Placement (Signed)
   CLINICAL SOCIAL WORK PLACEMENT  NOTE  Date:  03/30/2015  Patient Details  Name: Phillip [name]. MRN: [id number] Date of Birth: 1926/11/30  Clinical Social Work is seeking post-discharge placement for this patient at the [place] level of care (*CSW will initial, date and re-position this form in  chart as items are completed):  Yes   Patient/family provided with [place] Work Department's list of facilities offering this level of care within the geographic area requested by the patient (or if unable, by the patient's family).  Yes   Patient/family informed of their freedom to choose among providers that offer the needed level of care, that participate in Medicare, Medicaid or managed care program needed by the patient, have an available bed and are willing to accept the patient.  Yes   Patient/family informed of [place]'s ownership interest in [facility] and [facility], as well as of the fact that they are under no obligation to receive care at these facilities.  PASRR submitted to EDS on 03/29/15     PASRR number received on 03/29/15     Existing PASRR number confirmed on       FL2 transmitted to all facilities in geographic area requested by pt/family on       FL2 transmitted to all facilities within larger geographic area on       Patient informed that his/her managed care company has contracts with or will negotiate with certain facilities, including the following:        Yes   Patient/family informed of bed offers received.  Patient chooses bed at  [facility])     Physician recommends and patient chooses bed at      Patient to be transferred to  [facility]) on  .  Patient to be transferred to facility by EMS     Patient family notified on 03/29/15 of transfer.  Name of family member notified:  Wife and daughters at bedside     PHYSICIAN       Additional Comment:     _______________________________________________ Alonna Buckler, LCSW 03/30/2015, 12:36 AM

## 2015-04-01 LAB — CULTURE, BLOOD (ROUTINE X 2): Culture: NO GROWTH

## 2015-04-02 LAB — CULTURE, BLOOD (ROUTINE X 2)

## 2015-09-11 ENCOUNTER — Encounter: Payer: Self-pay | Admitting: Emergency Medicine

## 2015-09-11 ENCOUNTER — Ambulatory Visit
Admission: EM | Admit: 2015-09-11 | Discharge: 2015-09-11 | Disposition: A | Discharge status: Home / Self Care | Payer: Medicare Other | Attending: Family Medicine | Admitting: Family Medicine

## 2015-09-11 ENCOUNTER — Ambulatory Visit: Payer: Medicare Other

## 2015-09-11 DIAGNOSIS — W19XXXA Unspecified fall, initial encounter: Secondary | ICD-10-CM

## 2015-09-11 DIAGNOSIS — R0781 Pleurodynia: Secondary | ICD-10-CM | POA: Insufficient documentation

## 2015-09-11 DIAGNOSIS — S20212A Contusion of left front wall of thorax, initial encounter: Secondary | ICD-10-CM

## 2015-09-11 HISTORY — DX: Cerebral infarction, unspecified: I63.9

## 2015-09-11 MED ORDER — HYDROCODONE-ACETAMINOPHEN 5-325 MG PO TABS: ORAL_TABLET | ORAL | Status: DC

## 2015-09-11 NOTE — ED Provider Notes (Signed)
CSN: [id number]     Arrival date & time 09/11/15  1444 History   First MD Initiated Contact with Patient 09/11/15 1511     Chief Complaint  Patient presents with  . Fall  . Rib Pain    (Consider location/radiation/quality/duration/timing/severity/associated sxs/prior Treatment) HPI Comments: 82 yo male with a c/o 3 days of left lower rib pain after falling at home and hitting his left lower chest/rib area against corner of dresser. Denies hitting his head or loss of consciousness.  The history is provided by the patient.    Past Medical History  Diagnosis Date  . Hypertension   . Benign enlargement of prostate   . Kidney stone   . Coronary artery disease   . Dysphagia   . Stroke [facility])    Past Surgical History  Procedure Laterality Date  . Appendectomy    . Cataract extraction     Family History  Problem Relation Age of Onset  . Lung cancer Father    Social History  Substance Use Topics  . Smoking status: Former Research scientist (life sciences)  . Smokeless tobacco: None  . Alcohol Use: No    Review of Systems  Allergies  Review of patient's allergies indicates no known allergies.  Home Medications   Prior to Admission medications   Medication Sig Start Date End Date Taking? Authorizing Provider  acetaminophen (TYLENOL) 325 MG tablet Take 325-650 mg by mouth 3 (three) times daily.    Historical Provider, MD  albuterol (PROVENTIL HFA;VENTOLIN HFA) 108 (90 BASE) MCG/ACT inhaler Inhale 2 puffs into the lungs every 6 (six) hours as needed for wheezing or shortness of breath. 03/29/15   Nicholes Mango, MD  alfuzosin (UROXATRAL) 10 MG 24 hr tablet Take 10 mg by mouth at bedtime.    Historical Provider, MD  cefdinir (OMNICEF) 300 MG capsule Take 1 capsule (300 mg total) by mouth 2 (two) times daily. 03/29/15   Nicholes Mango, MD  cloNIDine (CATAPRES) 0.2 MG tablet Take 0.2 mg by mouth 2 (two) times daily.    Historical Provider, MD  clopidogrel (PLAVIX) 75 MG tablet Take 75 mg by mouth daily.     Historical Provider, MD  docusate sodium (COLACE) 100 MG capsule Take 1 capsule (100 mg total) by mouth 2 (two) times daily. 03/29/15   Nicholes Mango, MD  guaiFENesin-dextromethorphan (ROBITUSSIN DM) 100-10 MG/5ML syrup Take 5 mLs by mouth every 6 (six) hours as needed for cough. 03/29/15   Nicholes Mango, MD  hydrALAZINE (APRESOLINE) 25 MG tablet Take 3 tablets (75 mg total) by mouth 3 (three) times daily. 03/29/15   Nicholes Mango, MD  hydrochlorothiazide (HYDRODIURIL) 25 MG tablet Take 0.5 tablets (12.5 mg total) by mouth daily. 03/29/15   Nicholes Mango, MD  HYDROcodone-acetaminophen (NORCO/VICODIN) 5-325 MG tablet Half to one tab po qd prn 09/11/15   Norval Gable, MD  isosorbide dinitrate (ISORDIL) 30 MG tablet Take 30 mg by mouth daily.    Historical Provider, MD  metoprolol (LOPRESSOR) 50 MG tablet Take 50 mg by mouth 2 (two) times daily.    Historical Provider, MD  simvastatin (ZOCOR) 40 MG tablet Take 40 mg by mouth at bedtime.    Historical Provider, MD   Meds Ordered and Administered this Visit  Medications - No data to display  BP 160/86 mmHg  Pulse 63  Temp(Src) 97 F (36.1 C) (Tympanic)  Resp 17  Ht 6\' 3"  (1.905 m)  Wt 215 lb (97.523 kg)  BMI 26.87 kg/m2  SpO2 100% No data found.  Physical Exam  Constitutional: He appears well-developed and well-nourished. No distress.  Neck: Neck supple.  Cardiovascular: Normal rate, regular rhythm and normal heart sounds.   Pulmonary/Chest: Effort normal and breath sounds normal. No respiratory distress. He has no wheezes. He has no rales. He exhibits tenderness (over the left lower ribs).  Skin: He is not diaphoretic.  Nursing note and vitals reviewed.   ED Course  Procedures (including critical care time)  Labs Review Labs Reviewed - No data to display  Imaging Review Dg Ribs Unilateral W/chest Left  09/11/2015  CLINICAL DATA:  Left-sided rib pain after falling 3 days ago. Initial encounter. EXAM: LEFT RIBS AND CHEST - 3+ VIEW  COMPARISON:  03/28/2015 chest radiographs. FINDINGS: A metallic BB was placed over the area of pain inferolaterally on the left. There is no evidence of underlying acute rib fracture. Mild left basilar atelectasis is similar to the prior study. The lungs are otherwise clear. There is no pleural effusion or pneumothorax. The heart size and mediastinal contours are stable with aortic atherosclerosis. There are osteophytes throughout the visualized spine. IMPRESSION: No evidence of acute left-sided rib fracture, pleural or pericardial effusion. Electronically Signed   By: Richardean Sale M.D.   On: 09/11/2015 15:49     Visual Acuity Review  Right Eye Distance:   Left Eye Distance:   Bilateral Distance:    Right Eye Near:   Left Eye Near:    Bilateral Near:         MDM   1. Fall, initial encounter   2. Rib contusion, left, initial encounter    Discharge Medication List as of 09/11/2015  3:56 PM    START taking these medications   Details  HYDROcodone-acetaminophen (NORCO/VICODIN) 5-325 MG tablet Half to one tab po qd prn, Print       1. x-ray results and diagnosis reviewed with patient 2. rx as per orders above; reviewed possible side effects, interactions, risks and benefits  3. Recommend supportive treatment with ice, rest 4. Follow-up prn if symptoms worsen or don't improve    Norval Gable, MD 09/11/15 1627

## 2015-09-11 NOTE — ED Notes (Signed)
Patient c/o left sided rib pain after falling on Sunday. Patient states that he hit the corner of his wardrobe.  Patient denies hitting his head.

## 2015-09-11 NOTE — Discharge Instructions (Signed)
Chest Contusion A chest contusion is a deep bruise on your chest area. Contusions are the result of an injury that caused bleeding under the skin. A chest contusion may involve bruising of the skin, muscles, or ribs. The contusion may turn blue, purple, or yellow. Minor injuries will give you a painless contusion, but more severe contusions may stay painful and swollen for a few weeks. CAUSES  A contusion is usually caused by a blow, trauma, or direct force to an area of the body. SYMPTOMS   Swelling and redness of the injured area.  Discoloration of the injured area.  Tenderness and soreness of the injured area.  Pain. DIAGNOSIS  The diagnosis can be made by taking a history and performing a physical exam. An X-ray, CT scan, or MRI may be needed to determine if there were any associated injuries, such as broken bones (fractures) or internal injuries. TREATMENT  Often, the best treatment for a chest contusion is resting, icing, and applying cold compresses to the injured area. Deep breathing exercises may be recommended to reduce the risk of pneumonia. Over-the-counter medicines may also be recommended for pain control. HOME CARE INSTRUCTIONS   Put ice on the injured area.  Put ice in a plastic bag.  Place a towel between your skin and the bag.  Leave the ice on for 15-20 minutes, 03-04 times a day.  Only take over-the-counter or prescription medicines as directed by your caregiver. Your caregiver may recommend avoiding anti-inflammatory medicines (aspirin, ibuprofen, and naproxen) for 48 hours because these medicines may increase bruising.  Rest the injured area.  Perform deep-breathing exercises as directed by your caregiver.  Stop smoking if you smoke.  Do not lift objects over 5 pounds (2.3 kg) for 3 days or longer if recommended by your caregiver. SEEK IMMEDIATE MEDICAL CARE IF:   You have increased bruising or swelling.  You have pain that is getting worse.  You have  difficulty breathing.  You have dizziness, weakness, or fainting.  You have blood in your urine or stool.  You cough up or vomit blood.  Your swelling or pain is not relieved with medicines. MAKE SURE YOU:   Understand these instructions.  Will watch your condition.  Will get help right away if you are not doing well or get worse.   This information is not intended to replace advice given to you by your health care provider. Make sure you discuss any questions you have with your health care provider.   Document Released: 01/27/2001 Document Revised: 01/27/2012 Document Reviewed: 10/26/2011 Elsevier Interactive Patient Education 2016 [employer]  

## 2015-09-13 ENCOUNTER — Emergency Department: Payer: Medicare Other

## 2015-09-13 ENCOUNTER — Encounter: Payer: Self-pay | Admitting: Emergency Medicine

## 2015-09-13 ENCOUNTER — Emergency Department
Admission: EM | Admit: 2015-09-13 | Discharge: 2015-09-13 | Disposition: A | Discharge status: Home / Self Care | Payer: Medicare Other | Attending: Emergency Medicine | Admitting: Emergency Medicine

## 2015-09-13 DIAGNOSIS — R55 Syncope and collapse: Secondary | ICD-10-CM | POA: Diagnosis not present

## 2015-09-13 DIAGNOSIS — I2510 Atherosclerotic heart disease of native coronary artery without angina pectoris: Secondary | ICD-10-CM | POA: Diagnosis not present

## 2015-09-13 DIAGNOSIS — N390 Urinary tract infection, site not specified: Secondary | ICD-10-CM | POA: Insufficient documentation

## 2015-09-13 DIAGNOSIS — Z7902 Long term (current) use of antithrombotics/antiplatelets: Secondary | ICD-10-CM | POA: Insufficient documentation

## 2015-09-13 DIAGNOSIS — Z7982 Long term (current) use of aspirin: Secondary | ICD-10-CM | POA: Diagnosis not present

## 2015-09-13 DIAGNOSIS — I10 Essential (primary) hypertension: Secondary | ICD-10-CM | POA: Insufficient documentation

## 2015-09-13 DIAGNOSIS — R531 Weakness: Secondary | ICD-10-CM | POA: Diagnosis present

## 2015-09-13 DIAGNOSIS — Z79899 Other long term (current) drug therapy: Secondary | ICD-10-CM | POA: Insufficient documentation

## 2015-09-13 DIAGNOSIS — Z8673 Personal history of transient ischemic attack (TIA), and cerebral infarction without residual deficits: Secondary | ICD-10-CM | POA: Insufficient documentation

## 2015-09-13 DIAGNOSIS — Z87891 Personal history of nicotine dependence: Secondary | ICD-10-CM | POA: Diagnosis not present

## 2015-09-13 DIAGNOSIS — R1084 Generalized abdominal pain: Secondary | ICD-10-CM | POA: Diagnosis not present

## 2015-09-13 LAB — COMPREHENSIVE METABOLIC PANEL
ALBUMIN: 3.8 g/dL (ref 3.5–5.0)
ALK PHOS: 47 U/L (ref 38–126)
ALT: 9 U/L — ABNORMAL LOW (ref 17–63)
AST: 20 U/L (ref 15–41)
Anion gap: 9 (ref 5–15)
BILIRUBIN TOTAL: 1 mg/dL (ref 0.3–1.2)
BUN: 15 mg/dL (ref 6–20)
CALCIUM: 9 mg/dL (ref 8.9–10.3)
CO2: 25 mmol/L (ref 22–32)
CREATININE: 1.22 mg/dL (ref 0.61–1.24)
Chloride: 105 mmol/L (ref 101–111)
GFR calc Af Amer: >60 mL/min (ref >60)
GFR, EST NON AFRICAN AMERICAN: 52 mL/min — AB (ref >60)
GLUCOSE: 136 mg/dL — AB (ref 65–99)
Potassium: 4 mmol/L (ref 3.5–5.1)
Sodium: 139 mmol/L (ref 135–145)
TOTAL PROTEIN: 6.5 g/dL (ref 6.5–8.1)

## 2015-09-13 LAB — CBC WITH DIFFERENTIAL/PLATELET
BASOS ABS: 0 10*3/uL (ref 0–0.1)
BASOS PCT: 0 %
Eosinophils Absolute: 0 10*3/uL (ref 0–0.7)
Eosinophils Relative: 1 %
HEMATOCRIT: 35.1 % — AB (ref 40.0–52.0)
HEMOGLOBIN: 11.7 g/dL — AB (ref 13.0–18.0)
LYMPHS PCT: 6 %
Lymphs Abs: 0.3 10*3/uL — ABNORMAL LOW (ref 1.0–3.6)
MCH: 28.5 pg (ref 26.0–34.0)
MCHC: 33.3 g/dL (ref 32.0–36.0)
MCV: 85.6 fL (ref 80.0–100.0)
MONO ABS: 0.4 10*3/uL (ref 0.2–1.0)
MONOS PCT: 7 %
NEUTROS ABS: 4.3 10*3/uL (ref 1.4–6.5)
NEUTROS PCT: 86 %
Platelets: 127 10*3/uL — ABNORMAL LOW (ref 150–440)
RBC: 4.1 MIL/uL — ABNORMAL LOW (ref 4.40–5.90)
RDW: 16.2 % — AB (ref 11.5–14.5)
WBC: 5.1 10*3/uL (ref 3.8–10.6)

## 2015-09-13 LAB — URINALYSIS COMPLETE WITH MICROSCOPIC (ARMC ONLY)
Bilirubin Urine: NEGATIVE
Glucose, UA: NEGATIVE mg/dL
Hgb urine dipstick: NEGATIVE
KETONES UR: NEGATIVE mg/dL
Nitrite: NEGATIVE
PH: 6 (ref 5.0–8.0)
PROTEIN: NEGATIVE mg/dL
Specific Gravity, Urine: 1.006 (ref 1.005–1.030)

## 2015-09-13 LAB — TROPONIN I: Troponin I: <0.03 ng/mL (ref <0.031)

## 2015-09-13 LAB — LIPASE, BLOOD: LIPASE: 15 U/L (ref 11–51)

## 2015-09-13 MED ORDER — CEPHALEXIN 500 MG PO CAPS: 500.0000 mg | ORAL_CAPSULE | Freq: Four times a day (QID) | ORAL | Status: DC

## 2015-09-13 MED ORDER — SODIUM CHLORIDE 0.9 % IV BOLUS (SEPSIS)
500.0000 mL | Freq: Once | INTRAVENOUS | Status: AC
  Administered 2015-09-13: 500 mL via INTRAVENOUS

## 2015-09-13 MED ORDER — CEFTRIAXONE SODIUM 1 G IJ SOLR
1.0000 g | Freq: Once | INTRAMUSCULAR | Status: AC
  Administered 2015-09-13: 1 g via INTRAVENOUS
  Filled 2015-09-13: qty 10

## 2015-09-13 MED ORDER — IOPAMIDOL (ISOVUE-300) INJECTION 61%
100.0000 mL | Freq: Once | INTRAVENOUS | Status: AC | PRN
  Administered 2015-09-13: 100 mL via INTRAVENOUS

## 2015-09-13 MED ORDER — DIATRIZOATE MEGLUMINE & SODIUM 66-10 % PO SOLN
15.0000 mL | Freq: Once | ORAL | Status: AC
  Administered 2015-09-13: 15 mL via ORAL
  Filled 2015-09-13: qty 30

## 2015-09-13 MED ORDER — CLONIDINE HCL 0.1 MG PO TABS
0.2000 mg | ORAL_TABLET | ORAL | Status: AC
  Administered 2015-09-13: 0.2 mg via ORAL
  Filled 2015-09-13: qty 2

## 2015-09-13 NOTE — ED Notes (Signed)
Patient transported to X-ray 

## 2015-09-13 NOTE — ED Notes (Signed)
Patient returned from CT

## 2015-09-13 NOTE — ED Provider Notes (Signed)
[facility] Emergency Department Provider Note  ____________________________________________  Time seen: Approximately 11:46 AM  I have reviewed the triage vital signs and the nursing notes.   HISTORY  Chief Complaint Weakness    HPI Phillip [name]. is a 82 y.o. male with history of CAD, HTN, CVA presents for evaluation of a syncopal episode today, gradual onset, now resolved, initially severe. Patient does have issues with constipation. Today he was on the toilet bowl having a constipated bowel movement when he syncopized, was severely diaphoretic and poorly responsive. After that, he had 3 episodes of nonbloody nonbilious emesis. He is complaining of mild abdominal pain at this time. He did not hit his head or fall/injure himself today. 2 days ago, he did have a fall, was seen at urgent care and diagnosed with rib contusions. He has had some generalized weakness. No fever. He denies any chest pain at this time, or shortness of breath.   Past Medical History  Diagnosis Date  . Hypertension   . Benign enlargement of prostate   . Kidney stone   . Coronary artery disease   . Dysphagia   . Stroke [facility])     Patient Active Problem List   Diagnosis Date Noted  . HCAP (healthcare-associated pneumonia) 03/27/2015  . Gastroenteritis 02/23/2015  . Nausea with vomiting 02/23/2015  . Pneumonia 02/21/2015    Past Surgical History  Procedure Laterality Date  . Appendectomy    . Cataract extraction      Current Outpatient Rx  Name  Route  Sig  Dispense  Refill  . acetaminophen (TYLENOL) 325 MG tablet   Oral   Take 325-650 mg by mouth every 6 (six) hours as needed.          Marland Kitchen albuterol (PROVENTIL HFA;VENTOLIN HFA) 108 (90 BASE) MCG/ACT inhaler   Inhalation   Inhale 2 puffs into the lungs every 6 (six) hours as needed for wheezing or shortness of breath.   1 Inhaler   2   . alfuzosin (UROXATRAL) 10 MG 24 hr tablet   Oral   Take 10 mg by mouth at  bedtime.         Marland Kitchen aspirin 81 MG tablet   Oral   Take 81 mg by mouth daily.         . cloNIDine (CATAPRES) 0.2 MG tablet   Oral   Take 0.2 mg by mouth 2 (two) times daily.         . clopidogrel (PLAVIX) 75 MG tablet   Oral   Take 75 mg by mouth daily.         . furosemide (LASIX) 40 MG tablet   Oral   Take 40 mg by mouth daily.         . hydrALAZINE (APRESOLINE) 25 MG tablet   Oral   Take 3 tablets (75 mg total) by mouth 3 (three) times daily.   60 tablet   0   . hydrochlorothiazide (HYDRODIURIL) 25 MG tablet   Oral   Take 0.5 tablets (12.5 mg total) by mouth daily. Patient taking differently: Take 25 mg by mouth daily.    30 tablet   0   . HYDROcodone-acetaminophen (NORCO/VICODIN) 5-325 MG tablet      Half to one tab po qd prn Patient taking differently: Take 0.5-1 tablets by mouth daily as needed. Half to one tab po qd prn   8 tablet   0   . isosorbide mononitrate (IMDUR) 30 MG 24 hr tablet  Oral   Take 30 mg by mouth daily.         . metoprolol (LOPRESSOR) 50 MG tablet   Oral   Take 50 mg by mouth 2 (two) times daily.         . simvastatin (ZOCOR) 40 MG tablet   Oral   Take 40 mg by mouth at bedtime.           Allergies Review of patient's allergies indicates no known allergies.  Family History  Problem Relation Age of Onset  . Lung cancer Father     Social History Social History  Substance Use Topics  . Smoking status: Former Research scientist (life sciences)  . Smokeless tobacco: None  . Alcohol Use: No    Review of Systems Constitutional: No fever/chills Eyes: No visual changes. ENT: No sore throat. Cardiovascular: Denies chest pain. Respiratory: Denies shortness of breath. Gastrointestinal: + abdominal pain.  + nausea, +vomiting.  No diarrhea.  + constipation. Genitourinary: Negative for dysuria. Musculoskeletal: Negative for back pain. Skin: Negative for rash. Neurological: Negative for headaches, focal weakness or numbness.  10-point ROS  otherwise negative.  ____________________________________________   PHYSICAL EXAM:  VITAL SIGNS: ED Triage Vitals  Enc Vitals Group     BP 09/13/15 1136 144/76 mmHg     Pulse Rate 09/13/15 1136 57     Resp 09/13/15 1136 20     Temp 09/13/15 1136 97 F (36.1 C)     Temp Source 09/13/15 1136 Oral     SpO2 09/13/15 1136 98 %     Weight 09/13/15 1136 215 lb (97.523 kg)     Height 09/13/15 1136 6\' 3"  (1.905 m)     Head Cir --      Peak Flow --      Pain Score 09/13/15 1136 0     Pain Loc --      Pain Edu? --      Excl. in [place]? --     Constitutional: Alert and oriented. Well appearing and in no acute distress. Eyes: Conjunctivae are normal. PERRL. EOMI. Head: Atraumatic. Nose: No congestion/rhinnorhea. Mouth/Throat: Mucous membranes are moist.  Oropharynx non-erythematous. Neck: No stridor.  No cervical spine tenderness to palpation. Cardiovascular: Normal rate, regular rhythm. Grossly normal heart sounds.  Good peripheral circulation. Respiratory: Normal respiratory effort.  No retractions. Lungs CTAB. Gastrointestinal: Soft with mild tenderness. No distention.  No CVA tenderness. Genitourinary: deferred Musculoskeletal: No lower extremity tenderness nor edema.  No joint effusions. Neurologic:  Normal speech and language. No gross focal neurologic deficits are appreciated. 5 out of 5 strength bilateral upper and lower extremity, sensation intact to light touch throughout. Skin:  Skin is warm, dry and intact. No rash noted. Psychiatric: Mood and affect are normal. Speech and behavior are normal.  ____________________________________________   LABS (all labs ordered are listed, but only abnormal results are displayed)  Labs Reviewed  CBC WITH DIFFERENTIAL/PLATELET - Abnormal; Notable for the following:    RBC 4.10 (*)    Hemoglobin 11.7 (*)    HCT 35.1 (*)    RDW 16.2 (*)    Platelets 127 (*)    Lymphs Abs 0.3 (*)    All other components within normal limits   COMPREHENSIVE METABOLIC PANEL - Abnormal; Notable for the following:    Glucose, Bld 136 (*)    ALT 9 (*)    GFR calc non Af Amer 52 (*)    All other components within normal limits  URINALYSIS COMPLETEWITH MICROSCOPIC ([facility] ONLY) - Abnormal;  Notable for the following:    Color, Urine YELLOW (*)    APPearance HAZY (*)    Leukocytes, UA 1+ (*)    Bacteria, UA FEW (*)    Squamous Epithelial / LPF 0-5 (*)    All other components within normal limits  URINE CULTURE  LIPASE, BLOOD  TROPONIN I  TROPONIN I   ____________________________________________  EKG  ED ECG REPORT I, Joanne Gavel, the attending physician, personally viewed and interpreted this ECG.   Date: 09/13/2015  EKG Time: 11:55  Rate: 57  Rhythm: sinus rhythm with first-degree AV block.  Axis: normal  Intervals:first-degree A-V block   ST&T Change: No acute ST elevation. Q waves in V1, V2. EKG is unchanged from 02/21/2015.  ____________________________________________  RADIOLOGY  3 view abdominal plain films  IMPRESSION: 1. No bowel obstruction or free air. Small stool burden. 2. Increased left basilar atelectasis from exam 2 days prior.  CT abdomen and pelvis - pending  CT head - pending ____________________________________________   PROCEDURES  Procedure(s) performed: None  Critical Care performed: No  ____________________________________________   INITIAL IMPRESSION / ASSESSMENT AND PLAN / ED COURSE  Pertinent labs & imaging results that were available during my care of the patient were reviewed by me and considered in my medical decision making (see chart for details).  Phillip [name]. is a 82 y.o. male with history of CAD, HTN, CVA presents for evaluation of a syncopal episode today, gradual onset, now resolved, initially severe. On exam, he is well-appearing and in no acute distress. Vital signs stable, he is afebrile. He has an intact neurological exam. Suspect he might of had a  vasovagal episode in the setting of straining during bowel movement however given that he has some abdominal tenderness and vomiting, we'll obtain CT of the abdomen and pelvis.. Screening labs reviewed. CBC shows mild anemia, unremarkable CMP, normal lipase. Urinalysis is concerning for urinary tract infection, we'll give IV ceftriaxone at this time and anticipate that he can be discharged with outpatient antibiotics if his second troponin is negative, his CT of the abdomen and pelvis and CT head are unremarkable and he continues to appear well. EKG is unchanged from prior, first troponin negative. Care transferred to Dr. Jacqualine Code at 3:20 PM ____________________________________________   FINAL CLINICAL IMPRESSION(S) / ED DIAGNOSES  Final diagnoses:  Syncope, unspecified syncope type  Generalized abdominal pain  UTI (lower urinary tract infection)      Joanne Gavel, MD 09/13/15 1553

## 2015-09-13 NOTE — Discharge Instructions (Signed)
Urinary Tract Infection Urinary tract infections (UTIs) can develop anywhere along your urinary tract. Your urinary tract is your body's drainage system for removing wastes and extra water. Your urinary tract includes two kidneys, two ureters, a bladder, and a urethra. Your kidneys are a pair of bean-shaped organs. Each kidney is about the size of your fist. They are located below your ribs, one on each side of your spine. CAUSES Infections are caused by microbes, which are microscopic organisms, including fungi, viruses, and bacteria. These organisms are so small that they can only be seen through a microscope. Bacteria are the microbes that most commonly cause UTIs. SYMPTOMS  Symptoms of UTIs may vary by age and gender of the patient and by the location of the infection. Symptoms in young women typically include a frequent and intense urge to urinate and a painful, burning feeling in the bladder or urethra during urination. Older women and men are more likely to be tired, shaky, and weak and have muscle aches and abdominal pain. A fever may mean the infection is in your kidneys. Other symptoms of a kidney infection include pain in your back or sides below the ribs, nausea, and vomiting. DIAGNOSIS To diagnose a UTI, your caregiver will ask you about your symptoms. Your caregiver will also ask you to provide a urine sample. The urine sample will be tested for bacteria and white blood cells. White blood cells are made by your body to help fight infection. TREATMENT  Typically, UTIs can be treated with medication. Because most UTIs are caused by a bacterial infection, they usually can be treated with the use of antibiotics. The choice of antibiotic and length of treatment depend on your symptoms and the type of bacteria causing your infection. HOME CARE INSTRUCTIONS  If you were prescribed antibiotics, take them exactly as your caregiver instructs you. Finish the medication even if you feel better after  you have only taken some of the medication.  Drink enough water and fluids to keep your urine clear or pale yellow.  Avoid caffeine, tea, and carbonated beverages. They tend to irritate your bladder.  Empty your bladder often. Avoid holding urine for long periods of time.  Empty your bladder before and after sexual intercourse.  After a bowel movement, women should cleanse from front to back. Use each tissue only once. SEEK MEDICAL CARE IF:   You have back pain.  You develop a fever.  Your symptoms do not begin to resolve within 3 days. SEEK IMMEDIATE MEDICAL CARE IF:   You have severe back pain or lower abdominal pain.  You develop chills.  You have nausea or vomiting.  You have continued burning or discomfort with urination. MAKE SURE YOU:   Understand these instructions.  Will watch your condition.  Will get help right away if you are not doing well or get worse.   This information is not intended to replace advice given to you by your health care provider. Make sure you discuss any questions you have with your health care provider.   Document Released: 02/11/2005 Document Revised: 01/23/2015 Document Reviewed: 06/12/2011 Elsevier Interactive Patient Education 2016 [employer].  Near-Syncope Near-syncope (commonly known as near fainting) is sudden weakness, dizziness, or feeling like you might pass out. During an episode of near-syncope, you may also develop pale skin, have tunnel vision, or feel sick to your stomach (nauseous). Near-syncope may occur when getting up after sitting or while standing for a long time. It is caused by a sudden  decrease in blood flow to the brain. This decrease can result from various causes or triggers, most of which are not serious. However, because near-syncope can sometimes be a sign of something serious, a medical evaluation is required. The specific cause is often not determined. HOME CARE INSTRUCTIONS  Monitor your condition for any  changes. The following actions may help to alleviate any discomfort you are experiencing:  Have someone stay with you until you feel stable.  Lie down right away and prop your feet up if you start feeling like you might faint. Breathe deeply and steadily. Wait until all the symptoms have passed. Most of these episodes last only a few minutes. You may feel tired for several hours.   Drink enough fluids to keep your urine clear or pale yellow.   If you are taking blood pressure or heart medicine, get up slowly when seated or lying down. Take several minutes to sit and then stand. This can reduce dizziness.  Follow up with your health care provider as directed. SEEK IMMEDIATE MEDICAL CARE IF:   You have a severe headache.   You have unusual pain in the chest, abdomen, or back.   You are bleeding from the mouth or rectum, or you have black or tarry stool.   You have an irregular or very fast heartbeat.   You have repeated fainting or have seizure-like jerking during an episode.   You faint when sitting or lying down.   You have confusion.   You have difficulty walking.   You have severe weakness.   You have vision problems.  MAKE SURE YOU:   Understand these instructions.  Will watch your condition.  Will get help right away if you are not doing well or get worse.   This information is not intended to replace advice given to you by your health care provider. Make sure you discuss any questions you have with your health care provider.   Document Released: 05/04/2005 Document Revised: 05/09/2013 Document Reviewed: 10/07/2012 Elsevier Interactive Patient Education [employer].

## 2015-09-13 NOTE — ED Provider Notes (Signed)
Patient resting comfortably, discuss with the patient's family and him and he feels comfortable going home and states he feels well. We discussed his diagnosis, hydration, and careful return precautions and he is very agreeable.  We'll trial ambulation for him and make sure he feels well before being discharged. He'll follow closely with his doctor. His blood pressure was elevated, but he is also missed some his medications for which he takes multiple blood pressure medicines. After receiving clonidine his blood pressure is improved down into the low [id number] systolic, he is asymptomatic and I anticipate discharge with prescription for Keflex as well as close primary care follow-up.  Weight patient wished to take the remainder of his blood pressure medicines when he gets home. I think this is reasonable, he clearly has a well-established diagnosis and treatment for significant hypertension.  CT Head Wo Contrast (Final result) Result time: 09/13/15 15:25:26   Final result by Rad Results In Interface (09/13/15 15:25:26)   Narrative:   CLINICAL DATA: Increased weakness, history of stroke  EXAM: CT HEAD WITHOUT CONTRAST  TECHNIQUE: Contiguous axial images were obtained from the base of the skull through the vertex without intravenous contrast.  COMPARISON: 02/21/2015  FINDINGS: Global atrophy. Small lacunar infarct left frontal lobe stable. Small lacunar infarct right corona radiata stable. No evidence of vascular territory infarct. No hemorrhage or extra-axial fluid. No evidence of mass. No hydrocephalus. Calvarium intact. Visualized portions of paranasal sinuses clear except for small mucous retention cyst right maxillary sinus.  IMPRESSION: Chronic lacunar infarcts with no acute findings   Electronically Signed By: Skipper Cliche M.D. On: 09/13/2015 15:25          CT Abdomen Pelvis W Contrast (Final result) Result time: 09/13/15 15:29:37   Final result by Rad Results In  Interface (09/13/15 15:29:37)   Narrative:   CLINICAL DATA: Weakness  EXAM: CT ABDOMEN AND PELVIS WITH CONTRAST  TECHNIQUE: Multidetector CT imaging of the abdomen and pelvis was performed using the standard protocol following bolus administration of intravenous contrast.  CONTRAST: 167mL ISOVUE-300 IOPAMIDOL (ISOVUE-300) INJECTION 61%  COMPARISON: 02/21/2015  FINDINGS: Small left-sided pleural effusion is noted. Minimal left basilar atelectasis is seen.  Multiple hepatic cysts are again identified and stable from the prior study. The gallbladder is well distended. No evidence of cholelithiasis is seen. The spleen, adrenal glands and pancreas are stable in appearance from the prior study. Kidneys are well visualized bilaterally with cystic changes on the left stable from the prior study. Smaller cysts are noted on the right.  The bladder is well distended. Fluid collection is noted adjacent to the bladder bilaterally consistent with bladder diverticulum. The left-sided diverticulum has increased in size when compared with the prior exam. The prostate is significantly enlarged indenting upon the inferior aspect of the bladder. No bladder calculi are seen. No pelvic mass lesion or sidewall abnormality is noted. The appendix has been surgically removed. Mild diverticular change is noted without evidence of diverticulitis.  IMPRESSION: Small left pleural effusion with basilar atelectasis.  Bilateral bladder diverticula  Stable hepatic and renal cysts.  Diverticulosis without diverticulitis.   Electronically Signed By: Inez Catalina M.D. On: 09/13/2015 15:29          DG Abd Acute W/Chest (Final result) Result time: 09/13/15 13:46:24   Final result by Rad Results In Interface (09/13/15 13:46:24)   Narrative:   CLINICAL DATA: Constipation, rule out obstruction. Weakness today.  EXAM: DG ABDOMEN ACUTE W/ 1V CHEST  COMPARISON: Chest and left rib  radiographs 2  days prior.  FINDINGS: Cardiomediastinal contours are unchanged with stable tortuosity of the thoracic aorta. There is increased atelectasis at the left lung base. The right lung is clear. No pulmonary edema.  No free intra-abdominal air. No dilated bowel loops to suggest obstruction. Small stool burden. There are vascular calcifications. Degenerative change throughout spine and right shoulder. No acute osseous abnormalities are seen.  IMPRESSION: 1. No bowel obstruction or free air. Small stool burden. 2. Increased left basilar atelectasis from exam 2 days prior.   Electronically Signed By: Jeb Levering M.D. On: 09/13/2015 13:46     Delman Kitten, MD 09/13/15 1752

## 2015-09-13 NOTE — ED Notes (Signed)
Pt to ed with c/o weakness today.  Pt states he was sitting on the toilet this am attempting to have a bm. Last BM x 3 days ago.   Pt states he did take enema today after he had weakness episode and was able to have a bm prior to arrival.  Pt family states pt became diaphoretic while sitting on the toilet.  Pt denies loss of consciousness only reports all over, general weakness.

## 2015-09-15 LAB — URINE CULTURE

## 2015-11-07 ENCOUNTER — Observation Stay: Payer: Medicare Other

## 2015-11-07 ENCOUNTER — Encounter: Payer: Self-pay | Admitting: *Deleted

## 2015-11-07 ENCOUNTER — Observation Stay
Admission: EM | Admit: 2015-11-07 | Discharge: 2015-11-09 | Disposition: A | Discharge status: Home / Self Care | Payer: Medicare Other | Attending: Specialist | Admitting: Specialist

## 2015-11-07 DIAGNOSIS — K7689 Other specified diseases of liver: Secondary | ICD-10-CM | POA: Insufficient documentation

## 2015-11-07 DIAGNOSIS — K922 Gastrointestinal hemorrhage, unspecified: Principal | ICD-10-CM | POA: Diagnosis present

## 2015-11-07 DIAGNOSIS — Z9049 Acquired absence of other specified parts of digestive tract: Secondary | ICD-10-CM | POA: Diagnosis not present

## 2015-11-07 DIAGNOSIS — I700 Atherosclerosis of aorta: Secondary | ICD-10-CM | POA: Insufficient documentation

## 2015-11-07 DIAGNOSIS — Z9849 Cataract extraction status, unspecified eye: Secondary | ICD-10-CM | POA: Insufficient documentation

## 2015-11-07 DIAGNOSIS — M5136 Other intervertebral disc degeneration, lumbar region: Secondary | ICD-10-CM | POA: Diagnosis not present

## 2015-11-07 DIAGNOSIS — Z66 Do not resuscitate: Secondary | ICD-10-CM | POA: Insufficient documentation

## 2015-11-07 DIAGNOSIS — Z79899 Other long term (current) drug therapy: Secondary | ICD-10-CM | POA: Insufficient documentation

## 2015-11-07 DIAGNOSIS — Z8673 Personal history of transient ischemic attack (TIA), and cerebral infarction without residual deficits: Secondary | ICD-10-CM | POA: Diagnosis not present

## 2015-11-07 DIAGNOSIS — I2510 Atherosclerotic heart disease of native coronary artery without angina pectoris: Secondary | ICD-10-CM | POA: Diagnosis not present

## 2015-11-07 DIAGNOSIS — Z87442 Personal history of urinary calculi: Secondary | ICD-10-CM | POA: Insufficient documentation

## 2015-11-07 DIAGNOSIS — Z7902 Long term (current) use of antithrombotics/antiplatelets: Secondary | ICD-10-CM | POA: Insufficient documentation

## 2015-11-07 DIAGNOSIS — J90 Pleural effusion, not elsewhere classified: Secondary | ICD-10-CM | POA: Diagnosis not present

## 2015-11-07 DIAGNOSIS — Z79891 Long term (current) use of opiate analgesic: Secondary | ICD-10-CM | POA: Insufficient documentation

## 2015-11-07 DIAGNOSIS — N400 Enlarged prostate without lower urinary tract symptoms: Secondary | ICD-10-CM | POA: Diagnosis not present

## 2015-11-07 DIAGNOSIS — Z87891 Personal history of nicotine dependence: Secondary | ICD-10-CM | POA: Diagnosis not present

## 2015-11-07 DIAGNOSIS — Z7982 Long term (current) use of aspirin: Secondary | ICD-10-CM | POA: Diagnosis not present

## 2015-11-07 DIAGNOSIS — E785 Hyperlipidemia, unspecified: Secondary | ICD-10-CM | POA: Diagnosis not present

## 2015-11-07 DIAGNOSIS — I10 Essential (primary) hypertension: Secondary | ICD-10-CM | POA: Diagnosis not present

## 2015-11-07 DIAGNOSIS — K625 Hemorrhage of anus and rectum: Secondary | ICD-10-CM | POA: Insufficient documentation

## 2015-11-07 LAB — CBC
HEMATOCRIT: 32.4 % — AB (ref 40.0–52.0)
HEMATOCRIT: 28.6 % — AB (ref 40.0–52.0)
HEMOGLOBIN: 11.1 g/dL — AB (ref 13.0–18.0)
Hemoglobin: 9.7 g/dL — ABNORMAL LOW (ref 13.0–18.0)
MCH: 29.3 pg (ref 26.0–34.0)
MCH: 29 pg (ref 26.0–34.0)
MCHC: 34.2 g/dL (ref 32.0–36.0)
MCHC: 33.8 g/dL (ref 32.0–36.0)
MCV: 85.8 fL (ref 80.0–100.0)
MCV: 85.9 fL (ref 80.0–100.0)
PLATELETS: 103 10*3/uL — AB (ref 150–440)
Platelets: 116 10*3/uL — ABNORMAL LOW (ref 150–440)
RBC: 3.78 MIL/uL — AB (ref 4.40–5.90)
RBC: 3.33 MIL/uL — ABNORMAL LOW (ref 4.40–5.90)
RDW: 15.6 % — ABNORMAL HIGH (ref 11.5–14.5)
RDW: 15.7 % — AB (ref 11.5–14.5)
WBC: 3.8 10*3/uL (ref 3.8–10.6)
WBC: 4.4 10*3/uL (ref 3.8–10.6)

## 2015-11-07 LAB — COMPREHENSIVE METABOLIC PANEL
ALBUMIN: 3.7 g/dL (ref 3.5–5.0)
ALK PHOS: 47 U/L (ref 38–126)
ALT: 8 U/L — AB (ref 17–63)
ANION GAP: 6 (ref 5–15)
AST: 13 U/L — AB (ref 15–41)
BILIRUBIN TOTAL: 0.8 mg/dL (ref 0.3–1.2)
BUN: 22 mg/dL — ABNORMAL HIGH (ref 6–20)
CALCIUM: 9 mg/dL (ref 8.9–10.3)
CO2: 27 mmol/L (ref 22–32)
Chloride: 106 mmol/L (ref 101–111)
Creatinine, Ser: 1.1 mg/dL (ref 0.61–1.24)
GFR, EST NON AFRICAN AMERICAN: 59 mL/min — AB (ref >60)
GLUCOSE: 111 mg/dL — AB (ref 65–99)
POTASSIUM: 3.5 mmol/L (ref 3.5–5.1)
Sodium: 139 mmol/L (ref 135–145)
TOTAL PROTEIN: 6.1 g/dL — AB (ref 6.5–8.1)

## 2015-11-07 LAB — TYPE AND SCREEN
ABO/RH(D): O POS
ANTIBODY SCREEN: NEGATIVE

## 2015-11-07 LAB — HEMOGLOBIN: Hemoglobin: 9.7 g/dL — ABNORMAL LOW (ref 13.0–18.0)

## 2015-11-07 MED ORDER — METOPROLOL TARTRATE 50 MG PO TABS
50.0000 mg | ORAL_TABLET | Freq: Two times a day (BID) | ORAL | Status: DC
  Administered 2015-11-07 – 2015-11-09 (×3): 50 mg via ORAL
  Filled 2015-11-07 (×4): qty 1

## 2015-11-07 MED ORDER — ALBUTEROL SULFATE (2.5 MG/3ML) 0.083% IN NEBU
2.5000 mg | INHALATION_SOLUTION | RESPIRATORY_TRACT | Status: DC | PRN
Start: 2015-11-07 — End: 2015-11-09

## 2015-11-07 MED ORDER — SODIUM CHLORIDE 0.9 % IV SOLN
INTRAVENOUS | Status: DC
  Administered 2015-11-07 – 2015-11-08 (×3): via INTRAVENOUS

## 2015-11-07 MED ORDER — ALFUZOSIN HCL ER 10 MG PO TB24
10.0000 mg | ORAL_TABLET | Freq: Every day | ORAL | Status: DC
  Administered 2015-11-07 – 2015-11-08 (×2): 10 mg via ORAL
  Filled 2015-11-07 (×4): qty 1

## 2015-11-07 MED ORDER — ONDANSETRON HCL 4 MG PO TABS: 4.0000 mg | ORAL_TABLET | Freq: Four times a day (QID) | ORAL | Status: DC | PRN

## 2015-11-07 MED ORDER — HYDRALAZINE HCL 20 MG/ML IJ SOLN
10.0000 mg | Freq: Four times a day (QID) | INTRAMUSCULAR | Status: DC | PRN
  Administered 2015-11-07: 10 mg via INTRAVENOUS
  Filled 2015-11-07: qty 1

## 2015-11-07 MED ORDER — FUROSEMIDE 20 MG PO TABS
20.0000 mg | ORAL_TABLET | Freq: Every evening | ORAL | Status: DC
  Administered 2015-11-07 – 2015-11-08 (×2): 20 mg via ORAL
  Filled 2015-11-07 (×2): qty 1

## 2015-11-07 MED ORDER — DIATRIZOATE MEGLUMINE & SODIUM 66-10 % PO SOLN
15.0000 mL | ORAL | Status: AC
  Administered 2015-11-07 (×2): 15 mL via ORAL

## 2015-11-07 MED ORDER — ALBUTEROL SULFATE HFA 108 (90 BASE) MCG/ACT IN AERS: 2.0000 | INHALATION_SPRAY | Freq: Four times a day (QID) | RESPIRATORY_TRACT | Status: DC | PRN

## 2015-11-07 MED ORDER — ACETAMINOPHEN 325 MG PO TABS
650.0000 mg | ORAL_TABLET | Freq: Four times a day (QID) | ORAL | Status: DC | PRN
Start: 2015-11-07 — End: 2015-11-09

## 2015-11-07 MED ORDER — ONDANSETRON HCL 4 MG/2ML IJ SOLN: 4.0000 mg | Freq: Four times a day (QID) | INTRAMUSCULAR | Status: DC | PRN

## 2015-11-07 MED ORDER — ACETAMINOPHEN 650 MG RE SUPP: 650.0000 mg | Freq: Four times a day (QID) | RECTAL | Status: DC | PRN

## 2015-11-07 MED ORDER — HYDRALAZINE HCL 50 MG PO TABS
100.0000 mg | ORAL_TABLET | Freq: Three times a day (TID) | ORAL | Status: DC
  Administered 2015-11-07 – 2015-11-09 (×6): 100 mg via ORAL
  Filled 2015-11-07 (×4): qty 1
  Filled 2015-11-07: qty 2
  Filled 2015-11-07 (×2): qty 1
  Filled 2015-11-07 (×3): qty 2

## 2015-11-07 MED ORDER — ISOSORBIDE MONONITRATE ER 30 MG PO TB24
30.0000 mg | ORAL_TABLET | Freq: Every day | ORAL | Status: DC
  Administered 2015-11-07 – 2015-11-09 (×3): 30 mg via ORAL
  Filled 2015-11-07 (×3): qty 1

## 2015-11-07 MED ORDER — CLONIDINE HCL 0.1 MG PO TABS
0.2000 mg | ORAL_TABLET | Freq: Two times a day (BID) | ORAL | Status: DC
  Administered 2015-11-07 – 2015-11-09 (×5): 0.2 mg via ORAL
  Filled 2015-11-07 (×5): qty 2

## 2015-11-07 MED ORDER — SIMVASTATIN 40 MG PO TABS
40.0000 mg | ORAL_TABLET | Freq: Every day | ORAL | Status: DC
  Administered 2015-11-07 – 2015-11-08 (×2): 40 mg via ORAL
  Filled 2015-11-07 (×2): qty 1

## 2015-11-07 MED ORDER — HYDROCHLOROTHIAZIDE 25 MG PO TABS
25.0000 mg | ORAL_TABLET | Freq: Every day | ORAL | Status: DC
  Administered 2015-11-07 – 2015-11-09 (×3): 25 mg via ORAL
  Filled 2015-11-07 (×4): qty 1

## 2015-11-07 MED ORDER — HYDROCODONE-ACETAMINOPHEN 5-325 MG PO TABS: 0.5000 | ORAL_TABLET | Freq: Every day | ORAL | Status: DC | PRN

## 2015-11-07 MED ORDER — IOPAMIDOL (ISOVUE-300) INJECTION 61%
100.0000 mL | Freq: Once | INTRAVENOUS | Status: AC | PRN
  Administered 2015-11-07: 100 mL via INTRAVENOUS

## 2015-11-07 NOTE — Progress Notes (Signed)
Text page to Dr. Bridgett Larsson to inquire about IVF since BP elevated and whether or not pt needs to remain NPO. Per MD, maintain IV fluids and keep pt NPO even after CT scan.

## 2015-11-07 NOTE — Care Management Obs Status (Signed)
[place] NOTIFICATION   Patient Details  Name: Phillip [name]. MRN: [id number] Date of Birth: [birth year]   Medicare Observation Status Notification Given:  Yes    Beau Fanny, RN 11/07/2015, 11:04 AM

## 2015-11-07 NOTE — H&P (Addendum)
[facility] [place] NAME: Phillip [name]    MR#:  [id number]  DATE OF BIRTH:  01/15/1927  DATE OF ADMISSION:  11/07/2015  PRIMARY CARE PHYSICIAN: [facility]   REQUESTING/REFERRING PHYSICIAN: Daymon Larsen, MD  CHIEF COMPLAINT:   Chief Complaint  Patient presents with  . Rectal Bleeding   Rectal bleeding 2 episodes today. HISTORY OF PRESENT ILLNESS:  Phillip [name]  is a 82 y.o. male with a known history of Hypertension, CAD and CVA. The patient presented to the ED with 2 episodes of rectal bleeding today, which is fresh blood with clots. He denies any abdominal pain, nausea, vomiting or diarrhea. He said he never had GI bleeding before. Stool occult is positive. PAST MEDICAL HISTORY:   Past Medical History  Diagnosis Date  . Hypertension   . Benign enlargement of prostate   . Kidney stone   . Coronary artery disease   . Dysphagia   . Stroke [facility])     PAST SURGICAL HISTORY:   Past Surgical History  Procedure Laterality Date  . Appendectomy    . Cataract extraction      SOCIAL HISTORY:   Social History  Substance Use Topics  . Smoking status: Former Research scientist (life sciences)  . Smokeless tobacco: Not on file  . Alcohol Use: No    FAMILY HISTORY:   Family History  Problem Relation Age of Onset  . Lung cancer Father     DRUG ALLERGIES:  No Known Allergies  REVIEW OF SYSTEMS:  CONSTITUTIONAL: No fever, fatigue or weakness.  EYES: No blurred or double vision.  EARS, NOSE, AND THROAT: No tinnitus or ear pain.  RESPIRATORY: No cough, shortness of breath, wheezing or hemoptysis.  CARDIOVASCULAR: No chest pain, orthopnea, edema.  GASTROINTESTINAL: No nausea, vomiting, diarrhea or abdominal pain.  GENITOURINARY: No dysuria, hematuria.  ENDOCRINE: No polyuria, nocturia,  HEMATOLOGY: No anemia, easy bruising or bleeding SKIN: No rash or lesion. MUSCULOSKELETAL: No joint pain or arthritis.   NEUROLOGIC: No  tingling, numbness, weakness.  PSYCHIATRY: No anxiety or depression.   MEDICATIONS AT HOME:   Prior to Admission medications   Medication Sig Start Date End Date Taking? Authorizing Provider  albuterol (PROVENTIL HFA;VENTOLIN HFA) 108 (90 BASE) MCG/ACT inhaler Inhale 2 puffs into the lungs every 6 (six) hours as needed for wheezing or shortness of breath. 03/29/15  Yes Nicholes Mango, MD  alfuzosin (UROXATRAL) 10 MG 24 hr tablet Take 10 mg by mouth at bedtime.   Yes Historical Provider, MD  aspirin EC 81 MG tablet Take 81 mg by mouth daily.   Yes Historical Provider, MD  cloNIDine (CATAPRES) 0.2 MG tablet Take 0.2 mg by mouth 2 (two) times daily.   Yes Historical Provider, MD  clopidogrel (PLAVIX) 75 MG tablet Take 75 mg by mouth daily.   Yes Historical Provider, MD  furosemide (LASIX) 20 MG tablet Take 20 mg by mouth every evening.   Yes Historical Provider, MD  hydrALAZINE (APRESOLINE) 100 MG tablet Take 100 mg by mouth 3 (three) times daily.   Yes Historical Provider, MD  hydrochlorothiazide (HYDRODIURIL) 25 MG tablet Take 0.5 tablets (12.5 mg total) by mouth daily. Patient taking differently: Take 25 mg by mouth daily.  03/29/15  Yes Nicholes Mango, MD  HYDROcodone-acetaminophen (NORCO/VICODIN) 5-325 MG tablet Take 0.5-1 tablets by mouth daily as needed for moderate pain.   Yes Historical Provider, MD  isosorbide mononitrate (IMDUR) 30 MG 24 hr tablet Take 30 mg by mouth  daily.   Yes Historical Provider, MD  metoprolol (LOPRESSOR) 50 MG tablet Take 50 mg by mouth 2 (two) times daily.   Yes Historical Provider, MD  simvastatin (ZOCOR) 40 MG tablet Take 40 mg by mouth at bedtime.   Yes Historical Provider, MD  cephALEXin (KEFLEX) 500 MG capsule Take 1 capsule (500 mg total) by mouth 4 (four) times daily. Patient not taking: Reported on 11/07/2015 09/13/15   Delman Kitten, MD      VITAL SIGNS:  Blood pressure 171/85, pulse 54, temperature 98 F (36.7 C), temperature source Oral, resp. rate 17,  height 6\' 3"  (1.905 m), weight 215 lb (97.523 kg), SpO2 97 %.  PHYSICAL EXAMINATION:  GENERAL:  82 y.o.-year-old patient lying in the bed with no acute distress.  EYES: Pupils equal, round, reactive to light and accommodation. No scleral icterus. Extraocular muscles intact.  HEENT: Head atraumatic, normocephalic. Oropharynx and nasopharynx clear.  NECK:  Supple, no jugular venous distention. No thyroid enlargement, no tenderness.  LUNGS: Normal breath sounds bilaterally, no wheezing, rales,rhonchi or crepitation. No use of accessory muscles of respiration.  CARDIOVASCULAR: S1, S2 normal. No murmurs, rubs, or gallops.  ABDOMEN: Soft, nontender, nondistended. Bowel sounds present. No organomegaly or mass.  EXTREMITIES: No pedal edema, cyanosis, or clubbing.  NEUROLOGIC: Cranial nerves II through XII are intact. Muscle strength 5/5 in all extremities. Sensation intact. Gait not checked.  PSYCHIATRIC: The patient is alert and oriented x 3.  SKIN: No obvious rash, lesion, or ulcer.   LABORATORY PANEL:   CBC  Recent Labs Lab 11/07/15 0911  WBC 3.8  HGB 11.1*  HCT 32.4*  PLT 116*   ------------------------------------------------------------------------------------------------------------------  Chemistries   Recent Labs Lab 11/07/15 0911  NA 139  K 3.5  CL 106  CO2 27  GLUCOSE 111*  BUN 22*  CREATININE 1.10  CALCIUM 9.0  AST 13*  ALT 8*  ALKPHOS 47  BILITOT 0.8   ------------------------------------------------------------------------------------------------------------------  Cardiac Enzymes No results for input(s): TROPONINI in the last 168 hours. ------------------------------------------------------------------------------------------------------------------  RADIOLOGY:  No results found.  EKG:   Orders placed or performed during the hospital encounter of 09/13/15  . ED EKG  . ED EKG  . EKG 12-Lead  . EKG 12-Lead    IMPRESSION AND PLAN:   Rectal  bleeding The patient will be placed for observation, follow-up hemoglobin every 8 hours, nothing by mouth except medication and water, IV fluid support and a GI consult. Hold aspirin and Plavix. Abdominal and pelvis CT.  Anemia of chronic disease. Hemoglobin is stable for now, and check hemoglobin every 8 hours.  Hypertension, continue home hypertension medication.  CAD. Hold aspirin and Plavix, continue statin.  All the records are reviewed and case discussed with ED provider. Management plans discussed with the patient, his wife and daughter and they are in agreement.  CODE STATUS: DNR  TOTAL TIME TAKING CARE OF THIS PATIENT: 50 minutes.    Demetrios Loll M.D on 11/07/2015 at 11:04 AM  Between 7am to 6pm - Pager - [phone]  After 6pm go to [email or web address] - password [email or web address] [facility]  Office  [phone]  CC: Primary care physician; [place]

## 2015-11-07 NOTE — Care Management (Signed)
Placed in observation for gi bleed.  Patient's wife states that patient had 2 large bowel movements this morning that had dark and bright red blood.  Proceeded to ED by private vehicle.  Patient at baseline uses a cane and walker for ambulation.  He has "a mass" in his throat "that they are not going to do anyhting about due to his age."  if was to have surgery wife says was told would have to "go in through the back of his neck and it would be too much."  At present, anticipate patient will discharge home unless condition changes.  No issues accessing medical care, transportation, obtaining medications.  Has had [facility] and a stay at [facility] facility in the fall of 2016

## 2015-11-07 NOTE — ED Provider Notes (Signed)
Time Seen: Approximately 0913  I have reviewed the triage notes  Chief Complaint: Rectal Bleeding   History of Present Illness: Phillip [name]. is a 82 y.o. male who presents with 2 episodes of lower gastrointestinal bleeding today. Patient states he had 2 bowel movements this morning that had a maroon colored stool. His wife and himself were not sure if there were clots mixed in with the blood work it was stool. He denies any rectal pain. He denies any feelings of lightheadedness or chest discomfort. He denies any current abdominal pain. It is questionable whether he still on Plavix at this point he does take 81 mg of aspirin per day. The patient denies any back or flank pain or hematuria. His wife and himself were unsure when his last colonoscopy occurred.  Past Medical History  Diagnosis Date  . Hypertension   . Benign enlargement of prostate   . Kidney stone   . Coronary artery disease   . Dysphagia   . Stroke [facility])     Patient Active Problem List   Diagnosis Date Noted  . HCAP (healthcare-associated pneumonia) 03/27/2015  . Gastroenteritis 02/23/2015  . Nausea with vomiting 02/23/2015  . Pneumonia 02/21/2015    Past Surgical History  Procedure Laterality Date  . Appendectomy    . Cataract extraction      Past Surgical History  Procedure Laterality Date  . Appendectomy    . Cataract extraction      Current Outpatient Rx  Name  Route  Sig  Dispense  Refill  . acetaminophen (TYLENOL) 325 MG tablet   Oral   Take 325-650 mg by mouth every 6 (six) hours as needed.          Marland Kitchen albuterol (PROVENTIL HFA;VENTOLIN HFA) 108 (90 BASE) MCG/ACT inhaler   Inhalation   Inhale 2 puffs into the lungs every 6 (six) hours as needed for wheezing or shortness of breath.   1 Inhaler   2   . alfuzosin (UROXATRAL) 10 MG 24 hr tablet   Oral   Take 10 mg by mouth at bedtime.         Marland Kitchen aspirin 81 MG tablet   Oral   Take 81 mg by mouth daily.         . cephALEXin  (KEFLEX) 500 MG capsule   Oral   Take 1 capsule (500 mg total) by mouth 4 (four) times daily.   40 capsule   0   . cloNIDine (CATAPRES) 0.2 MG tablet   Oral   Take 0.2 mg by mouth 2 (two) times daily.         . clopidogrel (PLAVIX) 75 MG tablet   Oral   Take 75 mg by mouth daily.         . furosemide (LASIX) 40 MG tablet   Oral   Take 40 mg by mouth daily.         . hydrALAZINE (APRESOLINE) 25 MG tablet   Oral   Take 3 tablets (75 mg total) by mouth 3 (three) times daily.   60 tablet   0   . hydrochlorothiazide (HYDRODIURIL) 25 MG tablet   Oral   Take 0.5 tablets (12.5 mg total) by mouth daily. Patient taking differently: Take 25 mg by mouth daily.    30 tablet   0   . HYDROcodone-acetaminophen (NORCO/VICODIN) 5-325 MG tablet      Half to one tab po qd prn Patient taking differently: Take 0.5-1 tablets by mouth daily  as needed. Half to one tab po qd prn   8 tablet   0   . isosorbide mononitrate (IMDUR) 30 MG 24 hr tablet   Oral   Take 30 mg by mouth daily.         . metoprolol (LOPRESSOR) 50 MG tablet   Oral   Take 50 mg by mouth 2 (two) times daily.         . simvastatin (ZOCOR) 40 MG tablet   Oral   Take 40 mg by mouth at bedtime.           Allergies:  Review of patient's allergies indicates no known allergies.  Family History: Family History  Problem Relation Age of Onset  . Lung cancer Father     Social History: Social History  Substance Use Topics  . Smoking status: Former Research scientist (life sciences)  . Smokeless tobacco: None  . Alcohol Use: No     Review of Systems:   10 point review of systems was performed and was otherwise negative:  Constitutional: No fever Eyes: No visual disturbances ENT: No sore throat, ear pain Cardiac: No chest pain Respiratory: No shortness of breath, wheezing, or stridor Abdomen: No abdominal pain, no vomiting, No diarrhea Endocrine: No weight loss, No night sweats Extremities: No peripheral edema,  cyanosis Skin: No rashes, easy bruising Neurologic: No focal weakness, trouble with speech or swollowing Urologic: No dysuria, Hematuria, or urinary frequency   Physical Exam:  ED Triage Vitals  Enc Vitals Group     BP 11/07/15 0852 154/76 mmHg     Pulse Rate 11/07/15 0852 67     Resp 11/07/15 0852 20     Temp 11/07/15 0852 98 F (36.7 C)     Temp Source 11/07/15 0852 Oral     SpO2 11/07/15 0852 98 %     Weight 11/07/15 0852 215 lb (97.523 kg)     Height 11/07/15 0852 6\' 3"  (1.905 m)     Head Cir --      Peak Flow --      Pain Score --      Pain Loc --      Pain Edu? --      Excl. in [place]? --     General: Awake , Alert , and Oriented times 3; GCS 15 Head: Normal cephalic , atraumatic Eyes: Pupils equal , round, reactive to light Nose/Throat: No nasal drainage, patent upper airway without erythema or exudate.  Neck: Supple, Full range of motion, No anterior adenopathy or palpable thyroid masses Lungs: Clear to ascultation without wheezes , rhonchi, or rales Heart: Regular rate, regular rhythm without murmurs , gallops , or rubs Abdomen: Soft, non tender without rebound, guarding , or rigidity; bowel sounds positive and symmetric in all 4 quadrants. No organomegaly .        Extremities: 2 plus symmetric pulses. No edema, clubbing or cyanosis Neurologic: normal ambulation, Motor symmetric without deficits, sensory intact Skin: warm, dry, no rashes Rectal exam shows to be guaiac positive with normal sphincter tone. No palpable masses in the rectal vault. No visible external hemorrhoids.  Labs:   Laboratory work was reviewed with no significant abnormalities. Hemoglobin is 11.1   ED Course: * Patient's differential includes colon or rectal cancer, internal or external hemorrhoids, diverticulosis, AV malformations, etc. Given the patient's current clinical presentation and objective findings he appears to be stable from lower gastrointestinal bleeding I felt this was unlikely to  be an upper GI bleed at this time. Since  he has no reproducible abdominal pain this may be diverticulosis but there is a remote possibility of colon or rectal cancer.  Assessment: Acute lower gastrointestinal bleeding  i   Plan:  Inpatient management I spoke to the hospitalist team, further disposition and management depends upon their evaluation.            Daymon Larsen, MD 11/07/15 [phone]

## 2015-11-07 NOTE — Progress Notes (Addendum)
Prime doc paged and informed that pt had second large BM, bright red with clots. MD to place orders for second CBC. BP stable at this time.

## 2015-11-07 NOTE — ED Notes (Signed)
EDP at bedside  

## 2015-11-07 NOTE — Progress Notes (Signed)
Pt. admitted to unit, [id number] from ED, report from [place], [place]. Oriented to room, call bell, Ascom phones and staff. Bed in low position. Fall safety plan reviewed, yellow non-skid socks in place, bed alarm on, family at bedside. Full assessment to Epic; skin assessed with Purcell Nails., RN. Telemetry box verified with CCMD and Reather Converse: [id number].   BP elevated on admission  Filed Vitals:   11/07/15 1030 11/07/15 1152  BP: 171/85 214/99  Pulse: 54 66  Temp:  97.4 F (36.3 C)  Resp: 17 18  PO medications ordered and will be given. MD Bridgett Larsson paged and made aware, asked if wants metoprolol given as HR is 55-63. Orders to hold metoprolol, MD to place orders for IV BP meds. Family reports pt takes meds crushed in applesauce.   Will continue to monitor.

## 2015-11-07 NOTE — ED Notes (Signed)
Pt had two episodes of rectal bleeding this morning, pt denies pain and any other symptoms

## 2015-11-07 NOTE — Progress Notes (Signed)
Informed by nursing staff patient with a large bloody bowel movement including bright red blood with occasional clots Blood pressure stable given the amount of blood will check CBC and follow hematocrit trend if continued decrease or patient becomes symptomatic will require blood transfusion

## 2015-11-08 DIAGNOSIS — K625 Hemorrhage of anus and rectum: Secondary | ICD-10-CM

## 2015-11-08 DIAGNOSIS — K922 Gastrointestinal hemorrhage, unspecified: Secondary | ICD-10-CM | POA: Diagnosis not present

## 2015-11-08 LAB — BASIC METABOLIC PANEL
ANION GAP: 5 (ref 5–15)
BUN: 21 mg/dL — AB (ref 6–20)
CO2: 24 mmol/L (ref 22–32)
Calcium: 8.1 mg/dL — ABNORMAL LOW (ref 8.9–10.3)
Chloride: 108 mmol/L (ref 101–111)
Creatinine, Ser: 1.12 mg/dL (ref 0.61–1.24)
GFR calc Af Amer: >60 mL/min (ref >60)
GFR, EST NON AFRICAN AMERICAN: 57 mL/min — AB (ref >60)
Glucose, Bld: 127 mg/dL — ABNORMAL HIGH (ref 65–99)
POTASSIUM: 3.5 mmol/L (ref 3.5–5.1)
SODIUM: 137 mmol/L (ref 135–145)

## 2015-11-08 LAB — HEMOGLOBIN
HEMOGLOBIN: 8.3 g/dL — AB (ref 13.0–18.0)
HEMOGLOBIN: 8.7 g/dL — AB (ref 13.0–18.0)

## 2015-11-08 NOTE — Progress Notes (Signed)
[place] [facility] [place] NAME: Phillip [name]    MR#:  [id number]  DATE OF BIRTH:  [birth year]  SUBJECTIVE:   Patient is here due to multiple episodes of rectal bleeding. Hemoglobin stable today. No abdominal pain. No nausea/vomiting. No episodes of acute bleeding this morning.  REVIEW OF SYSTEMS:    Review of Systems  Constitutional: Negative for fever and chills.  HENT: Negative for congestion and tinnitus.   Eyes: Negative for blurred vision and double vision.  Respiratory: Negative for cough, shortness of breath and wheezing.   Cardiovascular: Negative for chest pain, orthopnea and PND.  Gastrointestinal: Positive for blood in stool. Negative for nausea, vomiting, abdominal pain and diarrhea.  Genitourinary: Negative for dysuria and hematuria.  Neurological: Negative for dizziness, sensory change and focal weakness.  All other systems reviewed and are negative.   Nutrition: Clear liquid Tolerating Diet: Yes Tolerating PT: Await evaluation   DRUG ALLERGIES:  No Known Allergies  VITALS:  Blood pressure 127/58, pulse 72, temperature 97.6 F (36.4 C), temperature source Oral, resp. rate 18, height 6\' 3"  (1.905 m), weight 97.523 kg (215 lb), SpO2 100 %.  PHYSICAL EXAMINATION:   Physical Exam  GENERAL:  82 y.o.-year-old patient lying in the bed with no acute distress.  EYES: Pupils equal, round, reactive to light and accommodation. No scleral icterus. Extraocular muscles intact.  HEENT: Head atraumatic, normocephalic. Oropharynx and nasopharynx clear.  Very hard of hearing NECK:  Supple, no jugular venous distention. No thyroid enlargement, no tenderness.  LUNGS: Normal breath sounds bilaterally, no wheezing, rales, rhonchi. No use of accessory muscles of respiration.  CARDIOVASCULAR: S1, S2 normal. II/VI SEM at LSB, No rubs, or gallops.  ABDOMEN: Soft, nontender, nondistended. Bowel sounds present. No organomegaly or mass.   EXTREMITIES: No cyanosis, clubbing or edema b/l.    NEUROLOGIC: Cranial nerves II through XII are intact. No focal Motor or sensory deficits b/l.  Globally weak PSYCHIATRIC: The patient is alert and oriented x 3.  SKIN: No obvious rash, lesion, or ulcer.    LABORATORY PANEL:   CBC  Recent Labs Lab 11/07/15 1942  11/08/15 0910  WBC 4.4  --   --   HGB 9.7*  < > 8.7*  HCT 28.6*  --   --   PLT 103*  --   --   < > = values in this interval not displayed. ------------------------------------------------------------------------------------------------------------------  Chemistries   Recent Labs Lab 11/07/15 0911 11/08/15 0108  NA 139 137  K 3.5 3.5  CL 106 108  CO2 27 24  GLUCOSE 111* 127*  BUN 22* 21*  CREATININE 1.10 1.12  CALCIUM 9.0 8.1*  AST 13*  --   ALT 8*  --   ALKPHOS 47  --   BILITOT 0.8  --    ------------------------------------------------------------------------------------------------------------------  Cardiac Enzymes No results for input(s): TROPONINI in the last 168 hours. ------------------------------------------------------------------------------------------------------------------  RADIOLOGY:  Ct Abdomen Pelvis W Contrast  11/07/2015  CLINICAL DATA:  Hypertension. Coronary artery disease and stroke. Two episodes of rectal bleeding. EXAM: CT ABDOMEN AND PELVIS WITH CONTRAST TECHNIQUE: Multidetector CT imaging of the abdomen and pelvis was performed using the standard protocol following bolus administration of intravenous contrast. CONTRAST:  182mL ISOVUE-300 IOPAMIDOL (ISOVUE-300) INJECTION 61% COMPARISON:  09/13/2015 FINDINGS: Lower chest:  Small left pleural effusion identified. Hepatobiliary: Numerous liver cysts are again noted. The gallbladder is normal. No biliary dilatation. Pancreas: No mass, inflammatory changes, or other significant abnormality. Spleen: Within normal limits in  size and appearance. Adrenals/Urinary Tract: Normal adrenal  glands. Unremarkable appearance of the kidneys scratch set unremarkable appearance of the right kidney. There is a hypodense structure within the posterior cortex of the left kidney measuring 6 mm, image 38 of series 2. This is too small to reliably characterize. Diverticula arises from the left posterior bladder. No focal filling defects identified within the bladder. Stomach/Bowel: The stomach appears normal. The small bowel loops have a normal course and caliber. No bowel obstruction. Unremarkable appearance of the proximal colon. Numerous distal colonic diverticula are identified without acute inflammation. Vascular/Lymphatic: Calcified atherosclerotic disease involves the abdominal aorta. No aneurysm. No enlarged retroperitoneal or mesenteric adenopathy. No enlarged pelvic or inguinal lymph nodes. Reproductive: Marked enlargement of the prostate gland. The gland measures 6.4 x 7.0 x 8.1 cm and has a volume of approximately 180 cc. Other: There is no ascites or focal fluid collections within the abdomen or pelvis. Musculoskeletal: Spondylosis is identified within the lumbar spine. No aggressive lytic or sclerotic bone lesions. IMPRESSION: 1. No acute findings identified within the abdomen or pelvis. 2. Marked enlargement of the prostate gland. 3. Liver cysts. 4. Aortic atherosclerosis 5. Lumbar degenerative disc disease 6. Small left pleural effusion. Electronically Signed   By: Kerby Moors M.D.   On: 11/07/2015 16:09     ASSESSMENT AND PLAN:   82 year old male past medical history of hypertension, BPH, coronary artery disease, history of previous CVA who presents to the hospital due to multiple episodes of rectal bleeding.  1. GI bleed-this is a lower GI bleed. -I suspect this is likely diverticular in nature. Hold aspirin, Plavix. Hemoglobin stable. -Await further gastroenterology input. We'll transfuse if hemoglobin drops below 7. -Started on a clear liquid diet.  2. Essential  hypertension-hemodynamically stable. -Continue clonidine, hydralazine, HCTZ, metoprolol, Imdur.  3. History of coronary artery disease-no acute chest pain. -Continue metoprolol, simvastatin, Imdur.  Hold ASa, Plavix given GI bleed.   4. Hyperlipidemia - cont. Simvastatin  5. Hx of previous CVA - hold ASA, Plavix given GI bleed.  - cont. STatin.    All the records are reviewed and case discussed with Care Management/Social Workerr. Management plans discussed with the patient, family and they are in agreement.  CODE STATUS: Full  DVT Prophylaxis: Ted's SCD's  TOTAL TIME TAKING CARE OF THIS PATIENT: 30 minutes.   POSSIBLE D/C IN 1-2 DAYS, DEPENDING ON CLINICAL CONDITION.   Henreitta Leber M.D on 11/08/2015 at 3:49 PM  Between 7am to 6pm - Pager - [phone]  After 6pm go to [email or web address] - password [email or web address] [facility]  Office  [phone]  CC: Primary care physician; [place]

## 2015-11-08 NOTE — Progress Notes (Signed)
GI bleed continues. GI consult today.  Denies pain, Denies other symptoms.

## 2015-11-08 NOTE — Consult Note (Signed)
[name], MD [address] [place], [place] [address] Phone: [phone] Fax : [phone]  Consultation  Referring Provider:     No ref. provider found Primary Care Physician:  [place] [facility] Primary Gastroenterologist:          Reason for Consultation:     Hematochezia  Date of Admission:  11/07/2015 Date of Consultation:  11/08/2015         HPI:   Phillip Rhew. is a 82 y.o. male who was admitted with rectal bleeding. The patient reports that he had multiple episodes of rectal bleeding. The patient denies any abdominal pain but does state he is lost approximately 20 pounds last year. The patient also reports that he had a colonoscopy more than 5 years ago but he says it was not for rectal bleeding was just for screening. The patient does not recall what was found at that time.the patient's hemoglobin back in October 2016 was 12.6 and he was admitted with a hemoglobin of 11.1 which went down to 8.7 this morning. He has had no further GI bleeding over the last day and was started on clear liquids earlier today. The patient denies ever having any episodes of rectal bleeding in the past.  Past Medical History  Diagnosis Date  . Hypertension   . Benign enlargement of prostate   . Kidney stone   . Coronary artery disease   . Dysphagia   . Stroke [facility])     Past Surgical History  Procedure Laterality Date  . Appendectomy    . Cataract extraction      Prior to Admission medications   Medication Sig Start Date End Date Taking? Authorizing Provider  albuterol (PROVENTIL HFA;VENTOLIN HFA) 108 (90 BASE) MCG/ACT inhaler Inhale 2 puffs into the lungs every 6 (six) hours as needed for wheezing or shortness of breath. 03/29/15  Yes Nicholes Mango, MD  alfuzosin (UROXATRAL) 10 MG 24 hr tablet Take 10 mg by mouth at bedtime.   Yes Historical Provider, MD  aspirin EC 81 MG tablet Take 81 mg by mouth daily.   Yes Historical Provider, MD  cloNIDine (CATAPRES) 0.2 MG  tablet Take 0.2 mg by mouth 2 (two) times daily.   Yes Historical Provider, MD  clopidogrel (PLAVIX) 75 MG tablet Take 75 mg by mouth daily.   Yes Historical Provider, MD  furosemide (LASIX) 20 MG tablet Take 20 mg by mouth every evening.   Yes Historical Provider, MD  hydrALAZINE (APRESOLINE) 100 MG tablet Take 100 mg by mouth 3 (three) times daily.   Yes Historical Provider, MD  hydrochlorothiazide (HYDRODIURIL) 25 MG tablet Take 0.5 tablets (12.5 mg total) by mouth daily. Patient taking differently: Take 25 mg by mouth daily.  03/29/15  Yes Nicholes Mango, MD  HYDROcodone-acetaminophen (NORCO/VICODIN) 5-325 MG tablet Take 0.5-1 tablets by mouth daily as needed for moderate pain.   Yes Historical Provider, MD  isosorbide mononitrate (IMDUR) 30 MG 24 hr tablet Take 30 mg by mouth daily.   Yes Historical Provider, MD  metoprolol (LOPRESSOR) 50 MG tablet Take 50 mg by mouth 2 (two) times daily.   Yes Historical Provider, MD  simvastatin (ZOCOR) 40 MG tablet Take 40 mg by mouth at bedtime.   Yes Historical Provider, MD  cephALEXin (KEFLEX) 500 MG capsule Take 1 capsule (500 mg total) by mouth 4 (four) times daily. Patient not taking: Reported on 11/07/2015 09/13/15   Delman Kitten, MD    Family History  Problem Relation Age of  Onset  . Lung cancer Father      Social History  Substance Use Topics  . Smoking status: Former Research scientist (life sciences)  . Smokeless tobacco: None  . Alcohol Use: No    Allergies as of 11/07/2015  . (No Known Allergies)    Review of Systems:    All systems reviewed and negative except where noted in HPI.   Physical Exam:  Vital signs in last 24 hours: Temp:  [97.6 F (36.4 C)-98 F (36.7 C)] 97.6 F (36.4 C) (06/23 1200) Pulse Rate:  [55-91] 72 (06/23 1305) Resp:  [14-19] 18 (06/23 1200) BP: (123-171)/(53-73) 127/58 mmHg (06/23 1305) SpO2:  [98 %-100 %] 100 % (06/23 1200) Last BM Date: 11/08/15 General:   Pleasant, cooperative in NAD Head:  Normocephalic and  atraumatic. Eyes:   No icterus.   Conjunctiva pink. PERRLA. Ears:  Normal auditory acuity. Neck:  Supple; no masses or thyroidomegaly Lungs: Respirations even and unlabored. Lungs clear to auscultation bilaterally.   No wheezes, crackles, or rhonchi.  Heart:  Regular rate and rhythm;  Without murmur, clicks, rubs or gallops Abdomen:  Soft, nondistended, nontender. Normal bowel sounds. No appreciable masses or hepatomegaly.  No rebound or guarding.  Rectal:  Not performed. Msk:  Symmetrical without gross deformities.    Extremities:  Without edema, cyanosis or clubbing. Neurologic:  Alert and oriented x3;  grossly normal neurologically. Skin:  Intact without significant lesions or rashes. Cervical Nodes:  No significant cervical adenopathy. Psych:  Alert and cooperative. Normal affect.  LAB RESULTS:  Recent Labs  11/07/15 0911  11/07/15 1942 11/08/15 0108 11/08/15 0910  WBC 3.8  --  4.4  --   --   HGB 11.1*  < > 9.7* 8.3* 8.7*  HCT 32.4*  --  28.6*  --   --   PLT 116*  --  103*  --   --   < > = values in this interval not displayed. BMET  Recent Labs  11/07/15 0911 11/08/15 0108  NA 139 137  K 3.5 3.5  CL 106 108  CO2 27 24  GLUCOSE 111* 127*  BUN 22* 21*  CREATININE 1.10 1.12  CALCIUM 9.0 8.1*   LFT  Recent Labs  11/07/15 0911  PROT 6.1*  ALBUMIN 3.7  AST 13*  ALT 8*  ALKPHOS 47  BILITOT 0.8   PT/INR No results for input(s): LABPROT, INR in the last 72 hours.  STUDIES: Ct Abdomen Pelvis W Contrast  11/07/2015  CLINICAL DATA:  Hypertension. Coronary artery disease and stroke. Two episodes of rectal bleeding. EXAM: CT ABDOMEN AND PELVIS WITH CONTRAST TECHNIQUE: Multidetector CT imaging of the abdomen and pelvis was performed using the standard protocol following bolus administration of intravenous contrast. CONTRAST:  189mL ISOVUE-300 IOPAMIDOL (ISOVUE-300) INJECTION 61% COMPARISON:  09/13/2015 FINDINGS: Lower chest:  Small left pleural effusion identified.  Hepatobiliary: Numerous liver cysts are again noted. The gallbladder is normal. No biliary dilatation. Pancreas: No mass, inflammatory changes, or other significant abnormality. Spleen: Within normal limits in size and appearance. Adrenals/Urinary Tract: Normal adrenal glands. Unremarkable appearance of the kidneys scratch set unremarkable appearance of the right kidney. There is a hypodense structure within the posterior cortex of the left kidney measuring 6 mm, image 38 of series 2. This is too small to reliably characterize. Diverticula arises from the left posterior bladder. No focal filling defects identified within the bladder. Stomach/Bowel: The stomach appears normal. The small bowel loops have a normal course and caliber. No bowel obstruction. Unremarkable appearance  of the proximal colon. Numerous distal colonic diverticula are identified without acute inflammation. Vascular/Lymphatic: Calcified atherosclerotic disease involves the abdominal aorta. No aneurysm. No enlarged retroperitoneal or mesenteric adenopathy. No enlarged pelvic or inguinal lymph nodes. Reproductive: Marked enlargement of the prostate gland. The gland measures 6.4 x 7.0 x 8.1 cm and has a volume of approximately 180 cc. Other: There is no ascites or focal fluid collections within the abdomen or pelvis. Musculoskeletal: Spondylosis is identified within the lumbar spine. No aggressive lytic or sclerotic bone lesions. IMPRESSION: 1. No acute findings identified within the abdomen or pelvis. 2. Marked enlargement of the prostate gland. 3. Liver cysts. 4. Aortic atherosclerosis 5. Lumbar degenerative disc disease 6. Small left pleural effusion. Electronically Signed   By: Kerby Moors M.D.   On: 11/07/2015 16:09      Impression / Plan:   Phillip Capehart. is a 81 y.o. y/o male with with a drop in hemoglobin from 11.1 to 8.7. The patient likely has a lower GI bleed as the cause of this bleeding. If the patient continues to have  further bleeding a colonoscopy should be considered. The patient cannot have a colonoscopy today because he has not been prepped for this. If the patient should need a colonoscopy he should be transferred to [facility] this week and since there is no GI coverage this weekend. The patient has been explained the plan and agrees with it.  Thank you for involving me in the care of this patient.      LOS: 1 day   [name], MD  11/08/2015, 4:07 PM   Note: This dictation was prepared with Dragon dictation along with smaller phrase technology. Any transcriptional errors that result from this process are unintentional.

## 2015-11-08 NOTE — Progress Notes (Signed)
Patient is awake and alert.  Oriented x 4. Denies pain. Just mod amt of bloody stool.

## 2015-11-09 LAB — CBC
HCT: 25.7 % — ABNORMAL LOW (ref 40.0–52.0)
Hemoglobin: 8.7 g/dL — ABNORMAL LOW (ref 13.0–18.0)
MCH: 29.1 pg (ref 26.0–34.0)
MCHC: 33.8 g/dL (ref 32.0–36.0)
MCV: 86 fL (ref 80.0–100.0)
Platelets: 94 10*3/uL — ABNORMAL LOW (ref 150–440)
RBC: 2.99 MIL/uL — ABNORMAL LOW (ref 4.40–5.90)
RDW: 15 % — ABNORMAL HIGH (ref 11.5–14.5)
WBC: 4.4 10*3/uL (ref 3.8–10.6)

## 2015-11-09 NOTE — Discharge Instructions (Signed)
HOLD ASPIRIN AND PLAVIX until seen by gastroenterologist. Follow up with gastroenterologist for possible outpatient colonoscopy

## 2015-11-09 NOTE — Discharge Summary (Signed)
[facility] [place] NAME: Phillip [name]    MR#:  [id number]  DATE OF BIRTH:  11/07/1926  DATE OF ADMISSION:  11/07/2015 ADMITTING PHYSICIAN: Demetrios Loll, MD  DATE OF DISCHARGE: 11/09/2015  PRIMARY CARE PHYSICIAN: [facility]    ADMISSION DIAGNOSIS:  Lower gastrointestinal bleed [K92.2]  DISCHARGE DIAGNOSIS:  Active Problems:   GIB (gastrointestinal bleeding)   GI bleed   Lower gastrointestinal bleed   Rectal bleeding   SECONDARY DIAGNOSIS:   Past Medical History  Diagnosis Date  . Hypertension   . Benign enlargement of prostate   . Kidney stone   . Coronary artery disease   . Dysphagia   . Stroke [facility])     HOSPITAL COURSE:   82 year old male past medical history of hypertension, BPH, coronary artery disease, history of previous CVA who presents to the hospital due to multiple episodes of rectal bleeding.  1. GI bleed-this was a lower GI bleed. -I suspect this is likely diverticular in nature. Patient was in the hospital for over 24 hours and now has not had any further bleeding. He was seen by gastroenterology who did recommend a colonoscopy but since he's having no acute bleeding this coming done as an outpatient. -His hemoglobin is currently stable at 8.7 and he did not require any transfusions while in the hospital. He is not tolerating a full liquid diet well. He is being advised to hold aspirin and Plavix until he sees gastroenterology as an outpatient and has his colonoscopy scheduled. -The patient and family are agreeable with this plan and therefore is being discharged home. He denies any abdominal pain or any other acute symptoms presently.  2. Essential hypertension- he will Continue clonidine, hydralazine, HCTZ, metoprolol, Imdur.  3. History of coronary artery disease- he had no acute chest pain while in the hospital. -he will Continue metoprolol, simvastatin, Imdur. he will cont. To Hold ASa, Plavix until  Colonoscopy done as outpatient.   4. Hyperlipidemia - cont. Simvastatin  5. Hx of previous CVA - hold ASA, Plavix given GI bleed.  - cont. Statin.   DISCHARGE CONDITIONS:   Stable  CONSULTS OBTAINED:  Treatment Team:  [name], MD  DRUG ALLERGIES:  No Known Allergies  DISCHARGE MEDICATIONS:   Current Discharge Medication List    CONTINUE these medications which have NOT CHANGED   Details  albuterol (PROVENTIL HFA;VENTOLIN HFA) 108 (90 BASE) MCG/ACT inhaler Inhale 2 puffs into the lungs every 6 (six) hours as needed for wheezing or shortness of breath. Qty: 1 Inhaler, Refills: 2    alfuzosin (UROXATRAL) 10 MG 24 hr tablet Take 10 mg by mouth at bedtime.    cloNIDine (CATAPRES) 0.2 MG tablet Take 0.2 mg by mouth 2 (two) times daily.    furosemide (LASIX) 20 MG tablet Take 20 mg by mouth every evening.    hydrALAZINE (APRESOLINE) 100 MG tablet Take 100 mg by mouth 3 (three) times daily.    hydrochlorothiazide (HYDRODIURIL) 25 MG tablet Take 0.5 tablets (12.5 mg total) by mouth daily. Qty: 30 tablet, Refills: 0    HYDROcodone-acetaminophen (NORCO/VICODIN) 5-325 MG tablet Take 0.5-1 tablets by mouth daily as needed for moderate pain.    isosorbide mononitrate (IMDUR) 30 MG 24 hr tablet Take 30 mg by mouth daily.    metoprolol (LOPRESSOR) 50 MG tablet Take 50 mg by mouth 2 (two) times daily.    simvastatin (ZOCOR) 40 MG tablet Take 40 mg by mouth at bedtime.  STOP taking these medications     aspirin EC 81 MG tablet      clopidogrel (PLAVIX) 75 MG tablet      cephALEXin (KEFLEX) 500 MG capsule          DISCHARGE INSTRUCTIONS:   DIET:  Cardiac diet  DISCHARGE CONDITION:  Stable  ACTIVITY:  Activity as tolerated  OXYGEN:  Home Oxygen: No.   Oxygen Delivery: room air  DISCHARGE LOCATION:  home   If you experience worsening of your admission symptoms, develop shortness of breath, life threatening emergency, suicidal or homicidal thoughts you  must seek medical attention immediately by calling 911 or calling your MD immediately  if symptoms less severe.  You Must read complete instructions/literature along with all the possible adverse reactions/side effects for all the Medicines you take and that have been prescribed to you. Take any new Medicines after you have completely understood and accpet all the possible adverse reactions/side effects.   Please note  You were cared for by a hospitalist during your hospital stay. If you have any questions about your discharge medications or the care you received while you were in the hospital after you are discharged, you can call the unit and asked to speak with the hospitalist on call if the hospitalist that took care of you is not available. Once you are discharged, your primary care physician will handle any further medical issues. Please note that NO REFILLS for any discharge medications will be authorized once you are discharged, as it is imperative that you return to your primary care physician (or establish a relationship with a primary care physician if you do not have one) for your aftercare needs so that they can reassess your need for medications and monitor your lab values.     Today   No acute bleeding presently, no abdominal pain, N/V.  NO chest pain, shortness of breath.   VITAL SIGNS:  Blood pressure 143/68, pulse 55, temperature 98.3 F (36.8 C), temperature source Oral, resp. rate 16, height 6\' 3"  (1.905 m), weight 97.523 kg (215 lb), SpO2 100 %.  I/O:   Intake/Output Summary (Last 24 hours) at 11/09/15 1423 Last data filed at 11/09/15 1328  Gross per 24 hour  Intake    760 ml  Output    800 ml  Net    -40 ml    PHYSICAL EXAMINATION:  GENERAL:  82 y.o.-year-old patient lying in the bed with no acute distress.  EYES: Pupils equal, round, reactive to light and accommodation. No scleral icterus. Extraocular muscles intact.  HEENT: Head atraumatic, normocephalic.  Oropharynx and nasopharynx clear.  NECK:  Supple, no jugular venous distention. No thyroid enlargement, no tenderness.  LUNGS: Normal breath sounds bilaterally, no wheezing, rales,rhonchi. No use of accessory muscles of respiration.  CARDIOVASCULAR: S1, S2 normal. No murmurs, rubs, or gallops.  ABDOMEN: Soft, non-tender, non-distended. Bowel sounds present. No organomegaly or mass.  EXTREMITIES: No pedal edema, cyanosis, or clubbing.  NEUROLOGIC: Cranial nerves II through XII are intact. No focal motor or sensory defecits b/l.  PSYCHIATRIC: The patient is alert and oriented x 3. Good affect.  SKIN: No obvious rash, lesion, or ulcer.   DATA REVIEW:   CBC  Recent Labs Lab 11/09/15 0548  WBC 4.4  HGB 8.7*  HCT 25.7*  PLT 94*    Chemistries   Recent Labs Lab 11/07/15 0911 11/08/15 0108  NA 139 137  K 3.5 3.5  CL 106 108  CO2 27 24  GLUCOSE 111*  127*  BUN 22* 21*  CREATININE 1.10 1.12  CALCIUM 9.0 8.1*  AST 13*  --   ALT 8*  --   ALKPHOS 47  --   BILITOT 0.8  --     Cardiac Enzymes No results for input(s): TROPONINI in the last 168 hours.  RADIOLOGY:  Ct Abdomen Pelvis W Contrast  11/07/2015  CLINICAL DATA:  Hypertension. Coronary artery disease and stroke. Two episodes of rectal bleeding. EXAM: CT ABDOMEN AND PELVIS WITH CONTRAST TECHNIQUE: Multidetector CT imaging of the abdomen and pelvis was performed using the standard protocol following bolus administration of intravenous contrast. CONTRAST:  13mL ISOVUE-300 IOPAMIDOL (ISOVUE-300) INJECTION 61% COMPARISON:  09/13/2015 FINDINGS: Lower chest:  Small left pleural effusion identified. Hepatobiliary: Numerous liver cysts are again noted. The gallbladder is normal. No biliary dilatation. Pancreas: No mass, inflammatory changes, or other significant abnormality. Spleen: Within normal limits in size and appearance. Adrenals/Urinary Tract: Normal adrenal glands. Unremarkable appearance of the kidneys scratch set unremarkable  appearance of the right kidney. There is a hypodense structure within the posterior cortex of the left kidney measuring 6 mm, image 38 of series 2. This is too small to reliably characterize. Diverticula arises from the left posterior bladder. No focal filling defects identified within the bladder. Stomach/Bowel: The stomach appears normal. The small bowel loops have a normal course and caliber. No bowel obstruction. Unremarkable appearance of the proximal colon. Numerous distal colonic diverticula are identified without acute inflammation. Vascular/Lymphatic: Calcified atherosclerotic disease involves the abdominal aorta. No aneurysm. No enlarged retroperitoneal or mesenteric adenopathy. No enlarged pelvic or inguinal lymph nodes. Reproductive: Marked enlargement of the prostate gland. The gland measures 6.4 x 7.0 x 8.1 cm and has a volume of approximately 180 cc. Other: There is no ascites or focal fluid collections within the abdomen or pelvis. Musculoskeletal: Spondylosis is identified within the lumbar spine. No aggressive lytic or sclerotic bone lesions. IMPRESSION: 1. No acute findings identified within the abdomen or pelvis. 2. Marked enlargement of the prostate gland. 3. Liver cysts. 4. Aortic atherosclerosis 5. Lumbar degenerative disc disease 6. Small left pleural effusion. Electronically Signed   By: Kerby Moors M.D.   On: 11/07/2015 16:09      Management plans discussed with the patient, family and they are in agreement.  CODE STATUS:     Code Status Orders        Start     Ordered   11/07/15 1151  Do not attempt resuscitation (DNR)   Continuous    Question Answer Comment  In the event of cardiac or respiratory ARREST Do not call a "code blue"   In the event of cardiac or respiratory ARREST Do not perform Intubation, CPR, defibrillation or ACLS   In the event of cardiac or respiratory ARREST Use medication by any route, position, wound care, and other measures to relive pain and  suffering. May use oxygen, suction and manual treatment of airway obstruction as needed for comfort.      11/07/15 1150    Code Status History    Date Active Date Inactive Code Status Order ID Comments User Context   03/27/2015 11:35 AM 03/29/2015  7:01 PM Full Code [id number]  Nicholes Mango, MD Inpatient   02/21/2015  7:07 PM 02/23/2015  2:54 PM DNR [id number]  Vaughan Basta, MD Inpatient      TOTAL TIME TAKING CARE OF THIS PATIENT: 40 minutes.    Henreitta Leber M.D on 11/09/2015 at 2:23 PM  Between 7am to 6pm -  Pager - [phone]  After 6pm go to [email or web address] - password [email or web address] [facility]  Office  [phone]  CC: Primary care physician; [place]

## 2015-11-09 NOTE — Progress Notes (Signed)
Discharge instructions along with home medication list and follow up gone over in great detail with patient and family. All verbalized that they understood instructions. Made patient aware that if gi bleed starts return to [place] since no GI doctor is on this weekend here. No prescriptions given to patient. DNR form given to patient. Patient to be discharged home on ra. No distress noted, no active bleed.

## 2015-11-15 DIAGNOSIS — D509 Iron deficiency anemia, unspecified: Secondary | ICD-10-CM | POA: Insufficient documentation

## 2015-11-15 DIAGNOSIS — K922 Gastrointestinal hemorrhage, unspecified: Secondary | ICD-10-CM | POA: Insufficient documentation

## 2015-11-18 ENCOUNTER — Ambulatory Visit (INDEPENDENT_AMBULATORY_CARE_PROVIDER_SITE_OTHER): Payer: Medicare Other | Admitting: Gastroenterology

## 2015-11-18 ENCOUNTER — Encounter: Payer: Self-pay | Admitting: Gastroenterology

## 2015-11-18 ENCOUNTER — Other Ambulatory Visit: Payer: Self-pay

## 2015-11-18 VITALS — BP 136/71 | HR 59 | Temp 96.9°F | Ht 75.0 in | Wt 206.0 lb

## 2015-11-18 DIAGNOSIS — D649 Anemia, unspecified: Secondary | ICD-10-CM | POA: Diagnosis not present

## 2015-11-18 DIAGNOSIS — I2510 Atherosclerotic heart disease of native coronary artery without angina pectoris: Secondary | ICD-10-CM | POA: Insufficient documentation

## 2015-11-18 DIAGNOSIS — N200 Calculus of kidney: Secondary | ICD-10-CM | POA: Insufficient documentation

## 2015-11-18 DIAGNOSIS — I10 Essential (primary) hypertension: Secondary | ICD-10-CM | POA: Insufficient documentation

## 2015-11-18 DIAGNOSIS — N400 Enlarged prostate without lower urinary tract symptoms: Secondary | ICD-10-CM | POA: Insufficient documentation

## 2015-11-18 MED ORDER — PEG 3350-KCL-NA BICARB-NACL 420 G PO SOLR: 4000.0000 mL | ORAL | Status: DC

## 2015-11-18 NOTE — Progress Notes (Signed)
Primary Care Physician: [place]  Primary Gastroenterologist:  Dr. [name]  Chief Complaint  Patient presents with  . Follow-up    GI Bleed    HPI: Phillip [name]. is a 82 y.o. male here for follow-up after being discharged from the hospital. The patient was in the hospital with passing of clots. The patient now reports that he has been having dark stools. The patient is usually constipated but is now having frequent bowel movements. There is no report of any nausea vomiting or any further bright red blood per rectum.  Current Outpatient Prescriptions  Medication Sig Dispense Refill  . alfuzosin (UROXATRAL) 10 MG 24 hr tablet Take 10 mg by mouth at bedtime.    . cloNIDine (CATAPRES) 0.2 MG tablet Take 0.2 mg by mouth 2 (two) times daily.    . furosemide (LASIX) 20 MG tablet Take 20 mg by mouth every evening.    . hydrALAZINE (APRESOLINE) 100 MG tablet Take 100 mg by mouth 3 (three) times daily.    . hydrochlorothiazide (HYDRODIURIL) 25 MG tablet Take 0.5 tablets (12.5 mg total) by mouth daily. (Patient taking differently: Take 25 mg by mouth daily. ) 30 tablet 0  . isosorbide mononitrate (IMDUR) 30 MG 24 hr tablet Take 30 mg by mouth daily.    . metoprolol (LOPRESSOR) 50 MG tablet Take 50 mg by mouth 2 (two) times daily.    . simvastatin (ZOCOR) 40 MG tablet Take 40 mg by mouth at bedtime.    . polyethylene glycol-electrolytes (TRILYTE) 420 g solution Take 4,000 mLs by mouth as directed. Drink one 8 oz glass every 20 to 30 mins until stools are clear. 4000 mL 0   No current facility-administered medications for this visit.    Allergies as of 11/18/2015  . (No Known Allergies)    ROS:  General: Negative for anorexia, weight loss, fever, chills, fatigue, weakness. ENT: Negative for hoarseness, difficulty swallowing , nasal congestion. CV: Negative for chest pain, angina, palpitations, dyspnea on exertion, peripheral edema.  Respiratory: Negative for dyspnea  at rest, dyspnea on exertion, cough, sputum, wheezing.  GI: See history of present illness. GU:  Negative for dysuria, hematuria, urinary incontinence, urinary frequency, nocturnal urination.  Endo: Negative for unusual weight change.    Physical Examination:   BP 136/71 mmHg  Pulse 59  Temp(Src) 96.9 F (36.1 C) (Oral)  Ht 6\' 3"  (1.905 m)  Wt 206 lb (93.441 kg)  BMI 25.75 kg/m2  General: Well-nourished, well-developed in no acute distress.  Eyes: No icterus. Conjunctivae pink. Neuro: Alert and oriented x 3.  Grossly intact. Hard of hearing  Skin: Warm and dry, no jaundice.   Psych: Alert and cooperative, normal mood and affect.  Labs:    Imaging Studies: Ct Abdomen Pelvis W Contrast  11/07/2015  CLINICAL DATA:  Hypertension. Coronary artery disease and stroke. Two episodes of rectal bleeding. EXAM: CT ABDOMEN AND PELVIS WITH CONTRAST TECHNIQUE: Multidetector CT imaging of the abdomen and pelvis was performed using the standard protocol following bolus administration of intravenous contrast. CONTRAST:  120mL ISOVUE-300 IOPAMIDOL (ISOVUE-300) INJECTION 61% COMPARISON:  09/13/2015 FINDINGS: Lower chest:  Small left pleural effusion identified. Hepatobiliary: Numerous liver cysts are again noted. The gallbladder is normal. No biliary dilatation. Pancreas: No mass, inflammatory changes, or other significant abnormality. Spleen: Within normal limits in size and appearance. Adrenals/Urinary Tract: Normal adrenal glands. Unremarkable appearance of the kidneys scratch set unremarkable appearance of the right kidney. There is a hypodense structure within the  posterior cortex of the left kidney measuring 6 mm, image 38 of series 2. This is too small to reliably characterize. Diverticula arises from the left posterior bladder. No focal filling defects identified within the bladder. Stomach/Bowel: The stomach appears normal. The small bowel loops have a normal course and caliber. No bowel obstruction.  Unremarkable appearance of the proximal colon. Numerous distal colonic diverticula are identified without acute inflammation. Vascular/Lymphatic: Calcified atherosclerotic disease involves the abdominal aorta. No aneurysm. No enlarged retroperitoneal or mesenteric adenopathy. No enlarged pelvic or inguinal lymph nodes. Reproductive: Marked enlargement of the prostate gland. The gland measures 6.4 x 7.0 x 8.1 cm and has a volume of approximately 180 cc. Other: There is no ascites or focal fluid collections within the abdomen or pelvis. Musculoskeletal: Spondylosis is identified within the lumbar spine. No aggressive lytic or sclerotic bone lesions. IMPRESSION: 1. No acute findings identified within the abdomen or pelvis. 2. Marked enlargement of the prostate gland. 3. Liver cysts. 4. Aortic atherosclerosis 5. Lumbar degenerative disc disease 6. Small left pleural effusion. Electronically Signed   By: Kerby Moors M.D.   On: 11/07/2015 16:09    Assessment and Plan:   Phillip [name]. is a 82 y.o. y/o male comes in after being discharged with a GI bleed. The patient had been admitted with passing of red clot and now reports having dark stools. The patient will have his hemoglobin checked today. He will also be set up for an EGD and colonoscopy. The patient and the family have been explained the plan and agree with it.   Note: This dictation was prepared with Dragon dictation along with smaller phrase technology. Any transcriptional errors that result from this process are unintentional.

## 2015-11-19 LAB — HEMOGLOBIN: HEMOGLOBIN: 9.2 g/dL — AB (ref 12.6–17.7)

## 2015-11-20 ENCOUNTER — Other Ambulatory Visit: Payer: Self-pay

## 2015-11-26 ENCOUNTER — Ambulatory Visit: Payer: Medicare Other | Admitting: Anesthesiology

## 2015-11-26 ENCOUNTER — Telehealth: Payer: Self-pay

## 2015-11-26 ENCOUNTER — Ambulatory Visit
Admission: RE | Admit: 2015-11-26 | Discharge: 2015-11-26 | Disposition: A | Discharge status: Home / Self Care | Payer: Medicare Other | Source: Ambulatory Visit | Attending: Gastroenterology | Admitting: Gastroenterology

## 2015-11-26 ENCOUNTER — Encounter: Payer: Self-pay | Admitting: Anesthesiology

## 2015-11-26 ENCOUNTER — Encounter
Admission: RE | Disposition: A | Discharge status: Home / Self Care | Payer: Self-pay | Source: Ambulatory Visit | Attending: Gastroenterology

## 2015-11-26 DIAGNOSIS — Z79899 Other long term (current) drug therapy: Secondary | ICD-10-CM | POA: Diagnosis not present

## 2015-11-26 DIAGNOSIS — H9190 Unspecified hearing loss, unspecified ear: Secondary | ICD-10-CM | POA: Diagnosis not present

## 2015-11-26 DIAGNOSIS — M5136 Other intervertebral disc degeneration, lumbar region: Secondary | ICD-10-CM | POA: Insufficient documentation

## 2015-11-26 DIAGNOSIS — I10 Essential (primary) hypertension: Secondary | ICD-10-CM | POA: Insufficient documentation

## 2015-11-26 DIAGNOSIS — K449 Diaphragmatic hernia without obstruction or gangrene: Secondary | ICD-10-CM | POA: Diagnosis not present

## 2015-11-26 DIAGNOSIS — K2970 Gastritis, unspecified, without bleeding: Secondary | ICD-10-CM | POA: Insufficient documentation

## 2015-11-26 DIAGNOSIS — J90 Pleural effusion, not elsewhere classified: Secondary | ICD-10-CM | POA: Insufficient documentation

## 2015-11-26 DIAGNOSIS — C184 Malignant neoplasm of transverse colon: Secondary | ICD-10-CM | POA: Diagnosis not present

## 2015-11-26 DIAGNOSIS — D122 Benign neoplasm of ascending colon: Secondary | ICD-10-CM | POA: Diagnosis not present

## 2015-11-26 DIAGNOSIS — N400 Enlarged prostate without lower urinary tract symptoms: Secondary | ICD-10-CM | POA: Insufficient documentation

## 2015-11-26 DIAGNOSIS — K921 Melena: Secondary | ICD-10-CM | POA: Insufficient documentation

## 2015-11-26 DIAGNOSIS — K5730 Diverticulosis of large intestine without perforation or abscess without bleeding: Secondary | ICD-10-CM | POA: Diagnosis not present

## 2015-11-26 DIAGNOSIS — K7689 Other specified diseases of liver: Secondary | ICD-10-CM | POA: Diagnosis not present

## 2015-11-26 DIAGNOSIS — J449 Chronic obstructive pulmonary disease, unspecified: Secondary | ICD-10-CM | POA: Insufficient documentation

## 2015-11-26 DIAGNOSIS — Z87891 Personal history of nicotine dependence: Secondary | ICD-10-CM | POA: Insufficient documentation

## 2015-11-26 DIAGNOSIS — K2950 Unspecified chronic gastritis without bleeding: Secondary | ICD-10-CM | POA: Diagnosis not present

## 2015-11-26 DIAGNOSIS — D123 Benign neoplasm of transverse colon: Secondary | ICD-10-CM | POA: Insufficient documentation

## 2015-11-26 DIAGNOSIS — I700 Atherosclerosis of aorta: Secondary | ICD-10-CM | POA: Diagnosis not present

## 2015-11-26 DIAGNOSIS — A048 Other specified bacterial intestinal infections: Secondary | ICD-10-CM | POA: Insufficient documentation

## 2015-11-26 DIAGNOSIS — K641 Second degree hemorrhoids: Secondary | ICD-10-CM | POA: Diagnosis not present

## 2015-11-26 DIAGNOSIS — D490 Neoplasm of unspecified behavior of digestive system: Secondary | ICD-10-CM | POA: Insufficient documentation

## 2015-11-26 DIAGNOSIS — D649 Anemia, unspecified: Secondary | ICD-10-CM | POA: Diagnosis present

## 2015-11-26 DIAGNOSIS — I2510 Atherosclerotic heart disease of native coronary artery without angina pectoris: Secondary | ICD-10-CM | POA: Insufficient documentation

## 2015-11-26 HISTORY — PX: ESOPHAGOGASTRODUODENOSCOPY (EGD) WITH PROPOFOL: SHX5813

## 2015-11-26 HISTORY — PX: COLONOSCOPY WITH PROPOFOL: SHX5780

## 2015-11-26 SURGERY — COLONOSCOPY WITH PROPOFOL
Anesthesia: General

## 2015-11-26 MED ORDER — LIDOCAINE HCL (CARDIAC) 20 MG/ML IV SOLN
INTRAVENOUS | Status: DC | PRN
  Administered 2015-11-26: 100 mg via INTRAVENOUS

## 2015-11-26 MED ORDER — SODIUM CHLORIDE 0.9 % IV SOLN
INTRAVENOUS | Status: DC
  Administered 2015-11-26: 1000 mL via INTRAVENOUS

## 2015-11-26 MED ORDER — LACTATED RINGERS IV SOLN
INTRAVENOUS | Status: DC | PRN
  Administered 2015-11-26: 10:00:00 via INTRAVENOUS

## 2015-11-26 MED ORDER — PROPOFOL 10 MG/ML IV BOLUS
INTRAVENOUS | Status: DC | PRN
  Administered 2015-11-26: 50 mg via INTRAVENOUS

## 2015-11-26 MED ORDER — EPHEDRINE SULFATE 50 MG/ML IJ SOLN
INTRAMUSCULAR | Status: DC | PRN
  Administered 2015-11-26: 15 mg via INTRAVENOUS

## 2015-11-26 MED ORDER — PROPOFOL 500 MG/50ML IV EMUL
INTRAVENOUS | Status: DC | PRN
  Administered 2015-11-26: 50 ug/kg/min via INTRAVENOUS

## 2015-11-26 MED ORDER — GLYCOPYRROLATE 0.2 MG/ML IJ SOLN
INTRAMUSCULAR | Status: DC | PRN
  Administered 2015-11-26: 0.2 mg via INTRAVENOUS

## 2015-11-26 MED ORDER — SPOT INK MARKER SYRINGE KIT
PACK | SUBMUCOSAL | Status: DC | PRN
  Administered 2015-11-26: 5 mL via SUBMUCOSAL

## 2015-11-26 NOTE — Telephone Encounter (Signed)
Pt notified of hemoglobin results.

## 2015-11-26 NOTE — Op Note (Signed)
[facility] Gastroenterology Patient Name: Phillip [name] Procedure Date: 11/26/2015 10:01 AM MRN: [id number] Account #: [id number] Date of Birth: 19-Oct-1926 Admit Type: Outpatient Age: 82 Room: [facility] ENDO ROOM 4 Gender: Male Note Status: Finalized Procedure:            Upper GI endoscopy Indications:          Melena Providers:            [name], MD Referring MD:         No Local Md, MD (Referring MD) Medicines:            Propofol per Anesthesia Complications:        No immediate complications. Procedure:            Pre-Anesthesia Assessment:                       - Prior to the procedure, a History and Physical was                        performed, and patient medications and allergies were                        reviewed. The patient's tolerance of previous                        anesthesia was also reviewed. The risks and benefits of                        the procedure and the sedation options and risks were                        discussed with the patient. All questions were                        answered, and informed consent was obtained. Prior                        Anticoagulants: The patient has taken no previous                        anticoagulant or antiplatelet agents. ASA Grade                        Assessment: II - A patient with mild systemic disease.                        After reviewing the risks and benefits, the patient was                        deemed in satisfactory condition to undergo the                        procedure.                       After obtaining informed consent, the endoscope was                        passed under direct vision. Throughout the procedure,  the patient's blood pressure, pulse, and oxygen                        saturations were monitored continuously. The                        Colonoscope was introduced through the mouth, and                        advanced to the second  part of duodenum. The upper GI                        endoscopy was accomplished without difficulty. The                        patient tolerated the procedure well. Findings:      A medium-sized hiatal hernia was present.      Localized mild inflammation characterized by erythema was found in the       gastric antrum. Biopsies were taken with a cold forceps for histology.      The examined duodenum was normal. Impression:           - Medium-sized hiatal hernia.                       - Gastritis. Biopsied.                       - Normal examined duodenum. Recommendation:       - Await pathology results. Procedure Code(s):    --- Professional ---                       [phone], Esophagogastroduodenoscopy, flexible, transoral;                        with biopsy, single or multiple Diagnosis Code(s):    --- Professional ---                       K92.1, Melena (includes Hematochezia)                       K29.70, Gastritis, unspecified, without bleeding CPT copyright 2016 American Medical Association. All rights reserved. The codes documented in this report are preliminary and upon coder review may  be revised to meet current compliance requirements. [name], MD 11/26/2015 10:12:49 AM This report has been signed electronically. Number of Addenda: 0 Note Initiated On: 11/26/2015 10:01 AM Total Procedure Duration: 0 hours 3 minutes 16 seconds       [facility]

## 2015-11-26 NOTE — H&P (View-Only) (Signed)
Primary Care Physician: [facility]  Primary Gastroenterologist:  Dr. [name]  Chief Complaint  Patient presents with  . Follow-up    GI Bleed    HPI: Phillip Salmons. is a 82 y.o. male here for follow-up after being discharged from the hospital. The patient was in the hospital with passing of clots. The patient now reports that he has been having dark stools. The patient is usually constipated but is now having frequent bowel movements. There is no report of any nausea vomiting or any further bright red blood per rectum.  Current Outpatient Prescriptions  Medication Sig Dispense Refill  . alfuzosin (UROXATRAL) 10 MG 24 hr tablet Take 10 mg by mouth at bedtime.    . cloNIDine (CATAPRES) 0.2 MG tablet Take 0.2 mg by mouth 2 (two) times daily.    . furosemide (LASIX) 20 MG tablet Take 20 mg by mouth every evening.    . hydrALAZINE (APRESOLINE) 100 MG tablet Take 100 mg by mouth 3 (three) times daily.    . hydrochlorothiazide (HYDRODIURIL) 25 MG tablet Take 0.5 tablets (12.5 mg total) by mouth daily. (Patient taking differently: Take 25 mg by mouth daily. ) 30 tablet 0  . isosorbide mononitrate (IMDUR) 30 MG 24 hr tablet Take 30 mg by mouth daily.    . metoprolol (LOPRESSOR) 50 MG tablet Take 50 mg by mouth 2 (two) times daily.    . simvastatin (ZOCOR) 40 MG tablet Take 40 mg by mouth at bedtime.    . polyethylene glycol-electrolytes (TRILYTE) 420 g solution Take 4,000 mLs by mouth as directed. Drink one 8 oz glass every 20 to 30 mins until stools are clear. 4000 mL 0   No current facility-administered medications for this visit.    Allergies as of 11/18/2015  . (No Known Allergies)    ROS:  General: Negative for anorexia, weight loss, fever, chills, fatigue, weakness. ENT: Negative for hoarseness, difficulty swallowing , nasal congestion. CV: Negative for chest pain, angina, palpitations, dyspnea on exertion, peripheral edema.  Respiratory: Negative for dyspnea  at rest, dyspnea on exertion, cough, sputum, wheezing.  GI: See history of present illness. GU:  Negative for dysuria, hematuria, urinary incontinence, urinary frequency, nocturnal urination.  Endo: Negative for unusual weight change.    Physical Examination:   BP 136/71 mmHg  Pulse 59  Temp(Src) 96.9 F (36.1 C) (Oral)  Ht 6\' 3"  (1.905 m)  Wt 206 lb (93.441 kg)  BMI 25.75 kg/m2  General: Well-nourished, well-developed in no acute distress.  Eyes: No icterus. Conjunctivae pink. Neuro: Alert and oriented x 3.  Grossly intact. Hard of hearing  Skin: Warm and dry, no jaundice.   Psych: Alert and cooperative, normal mood and affect.  Labs:    Imaging Studies: Ct Abdomen Pelvis W Contrast  11/07/2015  CLINICAL DATA:  Hypertension. Coronary artery disease and stroke. Two episodes of rectal bleeding. EXAM: CT ABDOMEN AND PELVIS WITH CONTRAST TECHNIQUE: Multidetector CT imaging of the abdomen and pelvis was performed using the standard protocol following bolus administration of intravenous contrast. CONTRAST:  122mL ISOVUE-300 IOPAMIDOL (ISOVUE-300) INJECTION 61% COMPARISON:  09/13/2015 FINDINGS: Lower chest:  Small left pleural effusion identified. Hepatobiliary: Numerous liver cysts are again noted. The gallbladder is normal. No biliary dilatation. Pancreas: No mass, inflammatory changes, or other significant abnormality. Spleen: Within normal limits in size and appearance. Adrenals/Urinary Tract: Normal adrenal glands. Unremarkable appearance of the kidneys scratch set unremarkable appearance of the right kidney. There is a hypodense structure within the  posterior cortex of the left kidney measuring 6 mm, image 38 of series 2. This is too small to reliably characterize. Diverticula arises from the left posterior bladder. No focal filling defects identified within the bladder. Stomach/Bowel: The stomach appears normal. The small bowel loops have a normal course and caliber. No bowel obstruction.  Unremarkable appearance of the proximal colon. Numerous distal colonic diverticula are identified without acute inflammation. Vascular/Lymphatic: Calcified atherosclerotic disease involves the abdominal aorta. No aneurysm. No enlarged retroperitoneal or mesenteric adenopathy. No enlarged pelvic or inguinal lymph nodes. Reproductive: Marked enlargement of the prostate gland. The gland measures 6.4 x 7.0 x 8.1 cm and has a volume of approximately 180 cc. Other: There is no ascites or focal fluid collections within the abdomen or pelvis. Musculoskeletal: Spondylosis is identified within the lumbar spine. No aggressive lytic or sclerotic bone lesions. IMPRESSION: 1. No acute findings identified within the abdomen or pelvis. 2. Marked enlargement of the prostate gland. 3. Liver cysts. 4. Aortic atherosclerosis 5. Lumbar degenerative disc disease 6. Small left pleural effusion. Electronically Signed   By: Kerby Moors M.D.   On: 11/07/2015 16:09    Assessment and Plan:   Phillip [name]. is a 82 y.o. y/o male comes in after being discharged with a GI bleed. The patient had been admitted with passing of red clot and now reports having dark stools. The patient will have his hemoglobin checked today. He will also be set up for an EGD and colonoscopy. The patient and the family have been explained the plan and agree with it.   Note: This dictation was prepared with Dragon dictation along with smaller phrase technology. Any transcriptional errors that result from this process are unintentional.

## 2015-11-26 NOTE — Anesthesia Preprocedure Evaluation (Signed)
Anesthesia Evaluation  Patient identified by MRN, date of birth, ID band Patient awake  General Assessment Comment:Very hard of hearing, slow mentation  Reviewed: Allergy & Precautions, NPO status   Airway Mallampati: III       Dental  (+) Poor Dentition   Pulmonary COPD, former smoker,     + decreased breath sounds      Cardiovascular Exercise Tolerance: Poor hypertension, Pt. on medications and Pt. on home beta blockers + CAD   Rhythm:Regular     Neuro/Psych CVA    GI/Hepatic negative GI ROS, Neg liver ROS,   Endo/Other    Renal/GU      Musculoskeletal negative musculoskeletal ROS (+)   Abdominal Normal abdominal exam  (+)   Peds  Hematology  (+) anemia ,   Anesthesia Other Findings   Reproductive/Obstetrics                             Anesthesia Physical Anesthesia Plan  ASA: III  Anesthesia Plan: General   Post-op Pain Management:    Induction: Intravenous  Airway Management Planned: Natural Airway and Nasal Cannula  Additional Equipment:   Intra-op Plan:   Post-operative Plan:   Informed Consent: I have reviewed the patients History and Physical, chart, labs and discussed the procedure including the risks, benefits and alternatives for the proposed anesthesia with the patient or authorized representative who has indicated his/her understanding and acceptance.     Plan Discussed with: CRNA  Anesthesia Plan Comments:         Anesthesia Quick Evaluation

## 2015-11-26 NOTE — Anesthesia Postprocedure Evaluation (Signed)
Anesthesia Post Note  Patient: Daz Fabien.  Procedure(s) Performed: Procedure(s) (LRB): COLONOSCOPY WITH PROPOFOL (N/A) ESOPHAGOGASTRODUODENOSCOPY (EGD) WITH PROPOFOL (N/A)  Patient location during evaluation: PACU Anesthesia Type: General Level of consciousness: awake Pain management: pain level controlled Vital Signs Assessment: post-procedure vital signs reviewed and stable Respiratory status: nonlabored ventilation Cardiovascular status: stable Anesthetic complications: no    Last Vitals:  Filed Vitals:   11/26/15 1050 11/26/15 1100  BP: 152/80 174/81  Pulse: 68 65  Temp:    Resp: 20 20    Last Pain: There were no vitals filed for this visit.               VAN STAVEREN,Ziyan Hillmer

## 2015-11-26 NOTE — Interval H&P Note (Signed)
History and Physical Interval Note:  11/26/2015 9:53 AM  Phillip [name].  has presented today for surgery, with the diagnosis of Anemia D64.9  The various methods of treatment have been discussed with the patient and family. After consideration of risks, benefits and other options for treatment, the patient has consented to  Procedure(s): COLONOSCOPY WITH PROPOFOL (N/A) ESOPHAGOGASTRODUODENOSCOPY (EGD) WITH PROPOFOL (N/A) as a surgical intervention .  The patient's history has been reviewed, patient examined, no change in status, stable for surgery.  I have reviewed the patient's chart and labs.  Questions were answered to the patient's satisfaction.     Clemie General [employer]

## 2015-11-26 NOTE — Op Note (Signed)
[facility] Gastroenterology Patient Name: Phillip [name] Procedure Date: 11/26/2015 10:00 AM MRN: [id number] Account #: [id number] Date of Birth: 1926-08-23 Admit Type: Outpatient Age: 82 Room: [facility] ENDO ROOM 4 Gender: Male Note Status: Finalized Procedure:            Colonoscopy Indications:          Hematochezia Providers:            [name], MD Referring MD:         No Local Md, MD (Referring MD) Medicines:            Propofol per Anesthesia Complications:        No immediate complications. Procedure:            Pre-Anesthesia Assessment:                       - Prior to the procedure, a History and Physical was                        performed, and patient medications and allergies were                        reviewed. The patient's tolerance of previous                        anesthesia was also reviewed. The risks and benefits of                        the procedure and the sedation options and risks were                        discussed with the patient. All questions were                        answered, and informed consent was obtained. Prior                        Anticoagulants: The patient has taken no previous                        anticoagulant or antiplatelet agents. ASA Grade                        Assessment: II - A patient with mild systemic disease.                        After reviewing the risks and benefits, the patient was                        deemed in satisfactory condition to undergo the                        procedure.                       After obtaining informed consent, the colonoscope was                        passed under direct vision. Throughout the procedure,  the patient's blood pressure, pulse, and oxygen                        saturations were monitored continuously. The                        Colonoscope was introduced through the anus and                        advanced to the the cecum,  identified by appendiceal                        orifice and ileocecal valve. The colonoscopy was                        performed without difficulty. The patient tolerated the                        procedure well. The quality of the bowel preparation                        was poor. Findings:      The perianal and digital rectal examinations were normal.      A 5 mm polyp was found in the transverse colon. The polyp was sessile.       The polyp was removed with a cold snare. Resection and retrieval were       complete.      A polypoid non-obstructing medium-sized mass was found in the ascending       colon. The mass was non-circumferential. No bleeding was present. This       was biopsied with a cold forceps for histology. Area was tattooed with       an injection of [place] ink.      A sessile non-obstructing medium-sized mass was found in the distal       transverse colon. The mass was non-circumferential. No bleeding was       present. This was biopsied with a cold forceps for histology. Area was       tattooed with an injection of [place] ink.      Non-bleeding internal hemorrhoids were found during retroflexion. The       hemorrhoids were Grade II (internal hemorrhoids that prolapse but reduce       spontaneously).      Multiple small-mouthed diverticula were found in the sigmoid colon. Impression:           - Preparation of the colon was poor.                       - One 5 mm polyp in the transverse colon, removed with                        a cold snare. Resected and retrieved.                       - Rule out malignancy, tumor in the ascending colon.                        Biopsied. Tattooed.                       -  Rule out malignancy, tumor in the distal transverse                        colon. Biopsied. Tattooed.                       - Non-bleeding internal hemorrhoids.                       - Diverticulosis in the sigmoid colon. Recommendation:       - Await pathology  results. Procedure Code(s):    --- Professional ---                       [phone], Colonoscopy, flexible; with removal of tumor(s),                        polyp(s), or other lesion(s) by snare technique                       [id number], Colonoscopy, flexible; with directed submucosal                        injection(s), any substance                       [id number], 59, Colonoscopy, flexible; with biopsy, single                        or multiple Diagnosis Code(s):    --- Professional ---                       K92.1, Melena (includes Hematochezia)                       D49.0, Neoplasm of unspecified behavior of digestive                        system                       D12.3, Benign neoplasm of transverse colon (hepatic                        flexure or splenic flexure) CPT copyright 2016 American Medical Association. All rights reserved. The codes documented in this report are preliminary and upon coder review may  be revised to meet current compliance requirements. [name], MD 11/26/2015 10:39:07 AM This report has been signed electronically. Number of Addenda: 0 Note Initiated On: 11/26/2015 10:00 AM Scope Withdrawal Time: 0 hours 12 minutes 10 seconds  Total Procedure Duration: 0 hours 20 minutes 27 seconds       [facility]

## 2015-11-26 NOTE — Transfer of Care (Signed)
Immediate Anesthesia Transfer of Care Note  Patient: Phillip [name].  Procedure(s) Performed: Procedure(s): COLONOSCOPY WITH PROPOFOL (N/A) ESOPHAGOGASTRODUODENOSCOPY (EGD) WITH PROPOFOL (N/A)  Patient Location: PACU  Anesthesia Type:General  Level of Consciousness: sedated  Airway & Oxygen Therapy: Patient Spontanous Breathing  Post-op Assessment: Report given to RN and Post -op Vital signs reviewed and stable  Post vital signs: Reviewed and stable  Last Vitals:  Filed Vitals:   11/26/15 0846  BP: 133/74  Pulse: 64  Temp: 35.7 C  Resp: 16    Last Pain: There were no vitals filed for this visit.       Complications: No apparent anesthesia complications

## 2015-11-28 LAB — SURGICAL PATHOLOGY

## 2015-12-01 ENCOUNTER — Encounter: Payer: Self-pay | Admitting: Gastroenterology

## 2015-12-03 ENCOUNTER — Telehealth: Payer: Self-pay

## 2015-12-03 NOTE — Telephone Encounter (Signed)
Spoke with pt's wife regarding a follow up appt. She needs to speak with her daughter to see when she can bring him. Will follow up with her tomorrow mid morning.

## 2015-12-03 NOTE — Telephone Encounter (Signed)
-----   Message from [name], MD sent at 12/02/2015 12:25 PM EDT ----- Please have the patient come in for a follow up.

## 2015-12-04 NOTE — Telephone Encounter (Signed)
Pt has been scheduled for a follow up appt with Dr. Allen Norris on Phillip [name], July 19th.

## 2015-12-05 ENCOUNTER — Other Ambulatory Visit: Payer: Self-pay

## 2015-12-05 ENCOUNTER — Encounter: Payer: Self-pay | Admitting: Gastroenterology

## 2015-12-05 ENCOUNTER — Ambulatory Visit (INDEPENDENT_AMBULATORY_CARE_PROVIDER_SITE_OTHER): Payer: Medicare Other | Admitting: Gastroenterology

## 2015-12-05 VITALS — BP 121/66 | HR 60 | Ht 75.5 in | Wt 210.0 lb

## 2015-12-05 DIAGNOSIS — C184 Malignant neoplasm of transverse colon: Secondary | ICD-10-CM | POA: Diagnosis not present

## 2015-12-05 DIAGNOSIS — A048 Other specified bacterial intestinal infections: Secondary | ICD-10-CM

## 2015-12-05 MED ORDER — OMEPRAZOLE 20 MG PO CPDR: 20.0000 mg | DELAYED_RELEASE_CAPSULE | Freq: Two times a day (BID) | ORAL | Status: DC

## 2015-12-05 MED ORDER — CLARITHROMYCIN 500 MG PO TABS: 500.0000 mg | ORAL_TABLET | Freq: Two times a day (BID) | ORAL | Status: DC

## 2015-12-05 MED ORDER — AMOXICILLIN 500 MG PO CAPS: 1000.0000 mg | ORAL_CAPSULE | Freq: Two times a day (BID) | ORAL | Status: DC

## 2015-12-05 NOTE — Progress Notes (Signed)
Primary Care Physician: [facility]  Primary Gastroenterologist:  Dr. [name]  Chief Complaint  Patient presents with  . Follow up procedure results    Colonoscopy and EGD on 11/26/15    HPI: Phillip [name]. is a 82 y.o. male here For follow-up after having a colonoscopy. The patient was found to have 2 masses in his colon. One was in the ascending and one was in the transverse colon. The patient was found to have invasive carcinoma pathology. He is now here to follow-up and discuss his treatment options.  Current Outpatient Prescriptions  Medication Sig Dispense Refill  . alfuzosin (UROXATRAL) 10 MG 24 hr tablet Take 10 mg by mouth at bedtime.    . cloNIDine (CATAPRES) 0.2 MG tablet Take 0.2 mg by mouth 2 (two) times daily.    . furosemide (LASIX) 20 MG tablet Take 20 mg by mouth every evening.    . hydrALAZINE (APRESOLINE) 100 MG tablet Take 100 mg by mouth 3 (three) times daily.    . hydrochlorothiazide (HYDRODIURIL) 25 MG tablet Take 0.5 tablets (12.5 mg total) by mouth daily. (Patient taking differently: Take 25 mg by mouth daily. ) 30 tablet 0  . isosorbide mononitrate (IMDUR) 30 MG 24 hr tablet Take 30 mg by mouth daily.    . metoprolol (LOPRESSOR) 50 MG tablet Take 50 mg by mouth 2 (two) times daily.    . polyethylene glycol-electrolytes (TRILYTE) 420 g solution Take 4,000 mLs by mouth as directed. Drink one 8 oz glass every 20 to 30 mins until stools are clear. 4000 mL 0  . simvastatin (ZOCOR) 40 MG tablet Take 40 mg by mouth at bedtime.     No current facility-administered medications for this visit.    Allergies as of 12/05/2015  . (No Known Allergies)    ROS:  General: Negative for anorexia, weight loss, fever, chills, fatigue, weakness. ENT: Negative for hoarseness, difficulty swallowing , nasal congestion. CV: Negative for chest pain, angina, palpitations, dyspnea on exertion, peripheral edema.  Respiratory: Negative for dyspnea at rest, dyspnea  on exertion, cough, sputum, wheezing.  GI: See history of present illness. GU:  Negative for dysuria, hematuria, urinary incontinence, urinary frequency, nocturnal urination.  Endo: Negative for unusual weight change.    Physical Examination:   BP 121/66 mmHg  Pulse 60  Ht 6' 3.5" (1.918 m)  Wt 210 lb (95.255 kg)  BMI 25.89 kg/m2  General: Well-nourished, well-developed in no acute distress.  Eyes: No icterus. Conjunctivae pink. Extremities: No lower extremity edema. No clubbing or deformities. Neuro: Alert and oriented x 3.  Grossly intact. Skin: Warm and dry, no jaundice.   Psych: Alert and cooperative, normal mood and affect.  Labs:    Imaging Studies: Ct Abdomen Pelvis W Contrast  11/07/2015  CLINICAL DATA:  Hypertension. Coronary artery disease and stroke. Two episodes of rectal bleeding. EXAM: CT ABDOMEN AND PELVIS WITH CONTRAST TECHNIQUE: Multidetector CT imaging of the abdomen and pelvis was performed using the standard protocol following bolus administration of intravenous contrast. CONTRAST:  140mL ISOVUE-300 IOPAMIDOL (ISOVUE-300) INJECTION 61% COMPARISON:  09/13/2015 FINDINGS: Lower chest:  Small left pleural effusion identified. Hepatobiliary: Numerous liver cysts are again noted. The gallbladder is normal. No biliary dilatation. Pancreas: No mass, inflammatory changes, or other significant abnormality. Spleen: Within normal limits in size and appearance. Adrenals/Urinary Tract: Normal adrenal glands. Unremarkable appearance of the kidneys scratch set unremarkable appearance of the right kidney. There is a hypodense structure within the posterior cortex of the  left kidney measuring 6 mm, image 38 of series 2. This is too small to reliably characterize. Diverticula arises from the left posterior bladder. No focal filling defects identified within the bladder. Stomach/Bowel: The stomach appears normal. The small bowel loops have a normal course and caliber. No bowel obstruction.  Unremarkable appearance of the proximal colon. Numerous distal colonic diverticula are identified without acute inflammation. Vascular/Lymphatic: Calcified atherosclerotic disease involves the abdominal aorta. No aneurysm. No enlarged retroperitoneal or mesenteric adenopathy. No enlarged pelvic or inguinal lymph nodes. Reproductive: Marked enlargement of the prostate gland. The gland measures 6.4 x 7.0 x 8.1 cm and has a volume of approximately 180 cc. Other: There is no ascites or focal fluid collections within the abdomen or pelvis. Musculoskeletal: Spondylosis is identified within the lumbar spine. No aggressive lytic or sclerotic bone lesions. IMPRESSION: 1. No acute findings identified within the abdomen or pelvis. 2. Marked enlargement of the prostate gland. 3. Liver cysts. 4. Aortic atherosclerosis 5. Lumbar degenerative disc disease 6. Small left pleural effusion. Electronically Signed   By: Kerby Moors M.D.   On: 11/07/2015 16:09    Assessment and Plan:   Phillip [name]. is a 82 y.o. y/o male with a recent colonoscopy showing 2 masses in his colon one being in the descending and one in the distal transverse. The patient would require a extended right hemicolectomy. At this patient's advanced age the risks are great for this surgery. I have given the options to the family who appear not to want to proceed with any surgery at the present time but will contact me after speaking to other family members to see if they have any differing of opinions.   Note: This dictation was prepared with Dragon dictation along with smaller phrase technology. Any transcriptional errors that result from this process are unintentional.

## 2015-12-11 ENCOUNTER — Telehealth: Payer: Self-pay

## 2015-12-11 NOTE — Telephone Encounter (Signed)
Phillip [name] (daughter) called for patient. Patient was in the office last week for his colonoscopy results. Cancer was discovered in the results. Dr. Allen Norris gave the patient some options and recommended that he not have surgery due to his age. The patient was told to think about it. Phillip [name] called to let Dr. Allen Norris know that her father will not be doing the surgery as he recommended. Phillip [name] would like to know when Dr. Allen Norris would like to see him back again to check on his health.

## 2015-12-11 NOTE — Telephone Encounter (Signed)
He does not need to follow with me. He should follow up with his PCP.

## 2015-12-11 NOTE — Telephone Encounter (Signed)
Please advise 

## 2015-12-12 NOTE — Telephone Encounter (Signed)
Spoke with pt's daughter Katharine Look and informed her pt needs to follow up with PCP at this point.

## 2016-02-14 ENCOUNTER — Telehealth: Payer: Self-pay

## 2016-02-14 ENCOUNTER — Other Ambulatory Visit: Payer: Self-pay

## 2016-02-14 DIAGNOSIS — W19XXXA Unspecified fall, initial encounter: Secondary | ICD-10-CM | POA: Insufficient documentation

## 2016-02-14 DIAGNOSIS — A048 Other specified bacterial intestinal infections: Secondary | ICD-10-CM

## 2016-02-14 DIAGNOSIS — I5030 Unspecified diastolic (congestive) heart failure: Secondary | ICD-10-CM | POA: Insufficient documentation

## 2016-02-14 DIAGNOSIS — I161 Hypertensive emergency: Secondary | ICD-10-CM | POA: Insufficient documentation

## 2016-02-14 DIAGNOSIS — I639 Cerebral infarction, unspecified: Secondary | ICD-10-CM | POA: Insufficient documentation

## 2016-02-14 DIAGNOSIS — C189 Malignant neoplasm of colon, unspecified: Secondary | ICD-10-CM | POA: Insufficient documentation

## 2016-02-14 DIAGNOSIS — S01111A Laceration without foreign body of right eyelid and periocular area, initial encounter: Secondary | ICD-10-CM | POA: Insufficient documentation

## 2016-02-14 NOTE — Telephone Encounter (Signed)
-----   Message from [place], [place] sent at 12/12/2015 10:57 AM EDT ----- Pt needs to recheck h pylori. Call daughter sandra and let her know.

## 2016-02-14 NOTE — Telephone Encounter (Signed)
Spoke with pt's daughter and advise her he is due for an H pylori stool retest. Pt previously treated for h pylori.

## 2016-03-12 DIAGNOSIS — Z9181 History of falling: Secondary | ICD-10-CM | POA: Insufficient documentation

## 2016-11-05 ENCOUNTER — Ambulatory Visit
Admission: RE | Admit: 2016-11-05 | Discharge: 2016-11-05 | Disposition: A | Discharge status: Home / Self Care | Payer: Medicare Other | Source: Ambulatory Visit | Attending: Family Medicine | Admitting: Family Medicine

## 2016-11-05 ENCOUNTER — Other Ambulatory Visit: Payer: Self-pay | Admitting: Family Medicine

## 2016-11-05 DIAGNOSIS — R05 Cough: Secondary | ICD-10-CM | POA: Diagnosis not present

## 2016-11-05 DIAGNOSIS — R059 Cough, unspecified: Secondary | ICD-10-CM

## 2017-01-17 DIAGNOSIS — J690 Pneumonitis due to inhalation of food and vomit: Secondary | ICD-10-CM | POA: Insufficient documentation

## 2017-01-18 DIAGNOSIS — I674 Hypertensive encephalopathy: Secondary | ICD-10-CM | POA: Insufficient documentation

## 2017-01-18 DIAGNOSIS — C188 Malignant neoplasm of overlapping sites of colon: Secondary | ICD-10-CM | POA: Insufficient documentation

## 2017-01-18 DIAGNOSIS — I10 Essential (primary) hypertension: Secondary | ICD-10-CM | POA: Insufficient documentation

## 2017-01-19 DIAGNOSIS — N179 Acute kidney failure, unspecified: Secondary | ICD-10-CM | POA: Insufficient documentation

## 2017-01-26 DIAGNOSIS — R339 Retention of urine, unspecified: Secondary | ICD-10-CM | POA: Insufficient documentation

## 2017-02-01 ENCOUNTER — Encounter: Payer: Self-pay | Admitting: Gastroenterology

## 2017-02-01 ENCOUNTER — Ambulatory Visit (INDEPENDENT_AMBULATORY_CARE_PROVIDER_SITE_OTHER): Payer: Medicare Other | Admitting: Gastroenterology

## 2017-02-01 ENCOUNTER — Other Ambulatory Visit: Payer: Self-pay

## 2017-02-01 VITALS — BP 152/87 | HR 93 | Temp 98.0°F | Ht 75.5 in | Wt 196.0 lb

## 2017-02-01 DIAGNOSIS — K921 Melena: Secondary | ICD-10-CM | POA: Diagnosis not present

## 2017-02-01 NOTE — Progress Notes (Signed)
Primary Care Physician: [facility]  Primary Gastroenterologist:  Dr. [name]  Chief Complaint  Patient presents with  . Blood In Stools  . Constipation    HPI: Phillip [name]. is a 83 y.o. male here with a history of a transverse colon malignancy. The patient had a colonoscopy that found this malignancy and with a meeting involving the patient and his family was decided not to proceed with any aggressive treatment of this. The  Patient now comes in after an episode of rectal bleeding. The patient's daughter states that he has had no further bleeding since making the appointment. There is no report of any abdominal pain fevers chills nausea or vomiting. The patient has had some weight loss.  Current Outpatient Prescriptions  Medication Sig Dispense Refill  . acetaminophen (TYLENOL) 500 MG tablet Take 1,000 mg by mouth.    Marland Kitchen alfuzosin (UROXATRAL) 10 MG 24 hr tablet Take 10 mg by mouth at bedtime.    Marland Kitchen amoxicillin (AMOXIL) 500 MG capsule Take 2 capsules (1,000 mg total) by mouth 2 (two) times daily. 56 capsule 0  . aspirin EC 81 MG tablet Take 81 mg by mouth.    . clarithromycin (BIAXIN) 500 MG tablet Take 1 tablet (500 mg total) by mouth 2 (two) times daily. 28 tablet 0  . cloNIDine (CATAPRES) 0.1 MG tablet   6  . cloNIDine (CATAPRES) 0.2 MG tablet Take 0.2 mg by mouth 2 (two) times daily.    . finasteride (PROSCAR) 5 MG tablet Take 5 mg by mouth.    . finasteride (PROSCAR) 5 MG tablet   0  . fluticasone (FLONASE) 50 MCG/ACT nasal spray Two sprays in each nostril once daily.    . fluticasone (FLONASE) 50 MCG/ACT nasal spray SHAKE LQ AND U 2 SPRAYS IEN QD  3  . furosemide (LASIX) 20 MG tablet Take 20 mg by mouth every evening.    . hydrALAZINE (APRESOLINE) 100 MG tablet Take 100 mg by mouth 3 (three) times daily.    . hydrochlorothiazide (HYDRODIURIL) 25 MG tablet Take 0.5 tablets (12.5 mg total) by mouth daily. (Patient taking differently: Take 25 mg by mouth daily.  ) 30 tablet 0  . isosorbide mononitrate (IMDUR) 30 MG 24 hr tablet Take 30 mg by mouth daily.    . metoprolol (LOPRESSOR) 50 MG tablet Take 50 mg by mouth 2 (two) times daily.    . metoprolol succinate (TOPROL-XL) 50 MG 24 hr tablet Take 50 mg by mouth.    Marland Kitchen omeprazole (PRILOSEC) 20 MG capsule Take 1 capsule (20 mg total) by mouth 2 (two) times daily before a meal. 60 capsule 6  . simvastatin (ZOCOR) 40 MG tablet Take 40 mg by mouth at bedtime.    . tamsulosin (FLOMAX) 0.4 MG CAPS capsule Take 0.4 mg by mouth.    . polyethylene glycol-electrolytes (TRILYTE) 420 g solution Take 4,000 mLs by mouth as directed. Drink one 8 oz glass every 20 to 30 mins until stools are clear. (Patient not taking: Reported on 02/01/2017) 4000 mL 0   No current facility-administered medications for this visit.     Allergies as of 02/01/2017  . (No Known Allergies)    ROS:  General: Negative for anorexia, weight loss, fever, chills, fatigue, weakness. ENT: Negative for hoarseness, difficulty swallowing , nasal congestion. CV: Negative for chest pain, angina, palpitations, dyspnea on exertion, peripheral edema.  Respiratory: Negative for dyspnea at rest, dyspnea on exertion, cough, sputum, wheezing.  GI: See history  of present illness. GU:  Negative for dysuria, hematuria, urinary incontinence, urinary frequency, nocturnal urination.  Endo: Negative for unusual weight change.    Physical Examination:   BP (!) 152/87   Pulse 93   Temp 98 F (36.7 C) (Oral)   Ht 6' 3.5" (1.918 m)   Wt 196 lb (88.9 kg)   BMI 24.17 kg/m   General: Well-nourished, well-developed in no acute distress.  Eyes: No icterus. Conjunctivae pink. Neuro: Alert and oriented x 3.  Grossly intact. Skin: Warm and dry, no jaundice.   Psych: Alert and cooperative, normal mood and affect.  Labs:    Imaging Studies: No results found.  Assessment and Plan:   Phillip [name]. is a 83 y.o. y/o male with a history of a colonic  malignancy that was elected to not proceed with any surgery or treatment. The patient has no further bleeding and the daughter states that the patient is stable. The family has been told that if the patient has any further bleeding may should consider taking him to the emergency room if the amount of bleeding is excessive. The patient and the family have been explained the plan and agree with it.    [name], MD. [facility]   Note: This dictation was prepared with Dragon dictation along with smaller phrase technology. Any transcriptional errors that result from this process are unintentional.

## 2017-12-16 ENCOUNTER — Ambulatory Visit: Payer: Medicare Other | Admitting: Gastroenterology

## 2017-12-16 VITALS — BP 129/73 | HR 76 | Ht 75.5 in | Wt 179.0 lb

## 2017-12-16 DIAGNOSIS — K921 Melena: Secondary | ICD-10-CM

## 2017-12-16 NOTE — Progress Notes (Signed)
Primary Care Physician: [facility]  Primary Gastroenterologist:  Dr. [name]  Chief Complaint  Patient presents with  . Blood In Stools    HPI: Phillip [name]. is a 84 y.o. male here for rectal bleeding.  The patient had rectal bleeding the past and had undergone a colonoscopy by me which found a transverse colon mass.  The mass was biopsied and was shown to be invasive adenocarcinoma.  A meeting was held with the family and the patient and it was decided not to proceed with any aggressive treatment due to his advanced age. The patient was recently in the hospital and [facility] for urinary tract infection and had a CT scan that showed his cancer.  He was also found to be anemic and was transfused.  The reason he is here today is because the hospital said to follow-up with his gastroenterologist.  Current Outpatient Medications  Medication Sig Dispense Refill  . acetaminophen (TYLENOL) 500 MG tablet Take 1,000 mg by mouth.    Marland Kitchen aspirin EC 81 MG tablet Take 81 mg by mouth.    . finasteride (PROSCAR) 5 MG tablet Take 5 mg by mouth.    . fluticasone (FLONASE) 50 MCG/ACT nasal spray Two sprays in each nostril once daily.    . furosemide (LASIX) 20 MG tablet Take 20 mg by mouth every evening.    . hydrALAZINE (APRESOLINE) 100 MG tablet Take 100 mg by mouth 3 (three) times daily.    . hydrochlorothiazide (HYDRODIURIL) 25 MG tablet Take 0.5 tablets (12.5 mg total) by mouth daily. (Patient taking differently: Take 25 mg by mouth daily. ) 30 tablet 0  . isosorbide mononitrate (IMDUR) 30 MG 24 hr tablet Take 30 mg by mouth daily.    Marland Kitchen omeprazole (PRILOSEC) 20 MG capsule Take 1 capsule (20 mg total) by mouth 2 (two) times daily before a meal. 60 capsule 6  . simvastatin (ZOCOR) 40 MG tablet Take 40 mg by mouth at bedtime.    . tamsulosin (FLOMAX) 0.4 MG CAPS capsule Take 0.4 mg by mouth.    Marland Kitchen alfuzosin (UROXATRAL) 10 MG 24 hr tablet Take 10 mg by mouth at bedtime.    .  cloNIDine (CATAPRES) 0.1 MG tablet   6  . cloNIDine (CATAPRES) 0.2 MG tablet Take 0.2 mg by mouth 2 (two) times daily.    . metoprolol (LOPRESSOR) 50 MG tablet Take 50 mg by mouth 2 (two) times daily.    . metoprolol succinate (TOPROL-XL) 50 MG 24 hr tablet Take 50 mg by mouth.     No current facility-administered medications for this visit.     Allergies as of 12/16/2017  . (No Known Allergies)    ROS:  General: Negative for anorexia, weight loss, fever, chills, fatigue, weakness. ENT: Negative for hoarseness, difficulty swallowing , nasal congestion. CV: Negative for chest pain, angina, palpitations, dyspnea on exertion, peripheral edema.  Respiratory: Negative for dyspnea at rest, dyspnea on exertion, cough, sputum, wheezing.  GI: See history of present illness. GU:  Negative for dysuria, hematuria, urinary incontinence, urinary frequency, nocturnal urination.  Endo: Negative for unusual weight change.    Physical Examination:   BP 129/73   Pulse 76   Ht 6' 3.5" (1.918 m)   Wt 179 lb (81.2 kg)   BMI 22.08 kg/m   General: Well-nourished, well-developed in no acute distress.  Eyes: No icterus. Conjunctivae pink. Mouth: Oropharyngeal mucosa moist and pink , no lesions erythema or exudate. Lungs: Clear to auscultation  bilaterally. Non-labored. Heart: Regular rate and rhythm, no murmurs rubs or gallops.  Abdomen: Bowel sounds are normal, nontender, nondistended, no hepatosplenomegaly or masses, no abdominal bruits or hernia , no rebound or guarding.   Extremities: No lower extremity edema. No clubbing or deformities. Neuro: Alert sitting in a wheelchair Skin: Warm and dry, no jaundice.   Psych: Alert and cooperative, normal mood and affect.  Labs:    Imaging Studies: No results found.  Assessment and Plan:   Phillip [name]. is a 84 y.o. y/o male who has a history of colon cancer found in the past with the decision not to pursue surgery chemotherapy or radiation.   The patient was recently in the hospital at [facility] for a urinary tract infection and was sent to me for follow-up.  The patient will have his labs checked and if he has anemia may need to be started on iron.  The family continues not to want to be aggressive nor subject the patient to any procedures or surgeries at this time.    [name], MD. [facility]   Note: This dictation was prepared with Dragon dictation along with smaller phrase technology. Any transcriptional errors that result from this process are unintentional.

## 2017-12-17 LAB — CBC WITH DIFFERENTIAL/PLATELET
BASOS ABS: 0 10*3/uL (ref 0.0–0.2)
Basos: 1 %
EOS (ABSOLUTE): 0.2 10*3/uL (ref 0.0–0.4)
Eos: 6 %
Hematocrit: 31.1 % — ABNORMAL LOW (ref 37.5–51.0)
Hemoglobin: 9.4 g/dL — ABNORMAL LOW (ref 13.0–17.7)
IMMATURE GRANS (ABS): 0 10*3/uL (ref 0.0–0.1)
Immature Granulocytes: 0 %
LYMPHS: 24 %
Lymphocytes Absolute: 0.9 10*3/uL (ref 0.7–3.1)
MCH: 23.7 pg — ABNORMAL LOW (ref 26.6–33.0)
MCHC: 30.2 g/dL — ABNORMAL LOW (ref 31.5–35.7)
MCV: 78 fL — AB (ref 79–97)
MONOCYTES: 8 %
Monocytes Absolute: 0.3 10*3/uL (ref 0.1–0.9)
NEUTROS ABS: 2.2 10*3/uL (ref 1.4–7.0)
Neutrophils: 61 %
PLATELETS: 237 10*3/uL (ref 150–450)
RBC: 3.97 x10E6/uL — ABNORMAL LOW (ref 4.14–5.80)
RDW: 24.5 % — ABNORMAL HIGH (ref 12.3–15.4)
WBC: 3.6 10*3/uL (ref 3.4–10.8)

## 2017-12-22 ENCOUNTER — Telehealth: Payer: Self-pay

## 2017-12-22 NOTE — Telephone Encounter (Signed)
Pt's wife and daughter notified of lab result.

## 2017-12-22 NOTE — Telephone Encounter (Signed)
-----   Message from [name], MD sent at 12/19/2017 11:55 AM EDT ----- The patient know that his hemoglobin was low but higher than it was when he was in the hospital.

## 2018-09-22 ENCOUNTER — Inpatient Hospital Stay
Admission: EM | Admit: 2018-09-22 | Discharge: 2018-09-24 | DRG: 177 | Disposition: A | Discharge status: Home Health | Payer: Medicare Other | Attending: Internal Medicine | Admitting: Internal Medicine

## 2018-09-22 ENCOUNTER — Other Ambulatory Visit: Payer: Self-pay

## 2018-09-22 ENCOUNTER — Emergency Department: Payer: Medicare Other

## 2018-09-22 DIAGNOSIS — Z7951 Long term (current) use of inhaled steroids: Secondary | ICD-10-CM

## 2018-09-22 DIAGNOSIS — J690 Pneumonitis due to inhalation of food and vomit: Secondary | ICD-10-CM

## 2018-09-22 DIAGNOSIS — Z20828 Contact with and (suspected) exposure to other viral communicable diseases: Secondary | ICD-10-CM | POA: Diagnosis present

## 2018-09-22 DIAGNOSIS — E876 Hypokalemia: Secondary | ICD-10-CM | POA: Diagnosis present

## 2018-09-22 DIAGNOSIS — Z8673 Personal history of transient ischemic attack (TIA), and cerebral infarction without residual deficits: Secondary | ICD-10-CM

## 2018-09-22 DIAGNOSIS — E538 Deficiency of other specified B group vitamins: Secondary | ICD-10-CM | POA: Diagnosis present

## 2018-09-22 DIAGNOSIS — Z79899 Other long term (current) drug therapy: Secondary | ICD-10-CM | POA: Diagnosis not present

## 2018-09-22 DIAGNOSIS — Z7982 Long term (current) use of aspirin: Secondary | ICD-10-CM

## 2018-09-22 DIAGNOSIS — Z515 Encounter for palliative care: Secondary | ICD-10-CM | POA: Diagnosis not present

## 2018-09-22 DIAGNOSIS — Z66 Do not resuscitate: Secondary | ICD-10-CM | POA: Diagnosis present

## 2018-09-22 DIAGNOSIS — N179 Acute kidney failure, unspecified: Secondary | ICD-10-CM | POA: Diagnosis present

## 2018-09-22 DIAGNOSIS — E860 Dehydration: Secondary | ICD-10-CM | POA: Diagnosis present

## 2018-09-22 DIAGNOSIS — T501X5A Adverse effect of loop [high-ceiling] diuretics, initial encounter: Secondary | ICD-10-CM | POA: Diagnosis present

## 2018-09-22 DIAGNOSIS — I2510 Atherosclerotic heart disease of native coronary artery without angina pectoris: Secondary | ICD-10-CM | POA: Diagnosis present

## 2018-09-22 DIAGNOSIS — Z801 Family history of malignant neoplasm of trachea, bronchus and lung: Secondary | ICD-10-CM | POA: Diagnosis not present

## 2018-09-22 DIAGNOSIS — E785 Hyperlipidemia, unspecified: Secondary | ICD-10-CM | POA: Diagnosis present

## 2018-09-22 DIAGNOSIS — J9601 Acute respiratory failure with hypoxia: Secondary | ICD-10-CM | POA: Diagnosis present

## 2018-09-22 DIAGNOSIS — D509 Iron deficiency anemia, unspecified: Secondary | ICD-10-CM | POA: Diagnosis present

## 2018-09-22 DIAGNOSIS — E872 Acidosis: Secondary | ICD-10-CM | POA: Diagnosis present

## 2018-09-22 DIAGNOSIS — N400 Enlarged prostate without lower urinary tract symptoms: Secondary | ICD-10-CM | POA: Diagnosis present

## 2018-09-22 DIAGNOSIS — I10 Essential (primary) hypertension: Secondary | ICD-10-CM | POA: Diagnosis present

## 2018-09-22 DIAGNOSIS — Z87891 Personal history of nicotine dependence: Secondary | ICD-10-CM | POA: Diagnosis not present

## 2018-09-22 LAB — COMPREHENSIVE METABOLIC PANEL
ALT: 13 U/L (ref 0–44)
AST: 32 U/L (ref 15–41)
Albumin: 3 g/dL — ABNORMAL LOW (ref 3.5–5.0)
Alkaline Phosphatase: 42 U/L (ref 38–126)
Anion gap: 11 (ref 5–15)
BUN: 26 mg/dL — ABNORMAL HIGH (ref 8–23)
CO2: 27 mmol/L (ref 22–32)
Calcium: 8.5 mg/dL — ABNORMAL LOW (ref 8.9–10.3)
Chloride: 101 mmol/L (ref 98–111)
Creatinine, Ser: 1.78 mg/dL — ABNORMAL HIGH (ref 0.61–1.24)
GFR calc Af Amer: 38 mL/min — ABNORMAL LOW (ref >60)
GFR calc non Af Amer: 33 mL/min — ABNORMAL LOW (ref >60)
Glucose, Bld: 147 mg/dL — ABNORMAL HIGH (ref 70–99)
Potassium: 2.7 mmol/L — CL (ref 3.5–5.1)
Sodium: 139 mmol/L (ref 135–145)
Total Bilirubin: 0.8 mg/dL (ref 0.3–1.2)
Total Protein: 6.3 g/dL — ABNORMAL LOW (ref 6.5–8.1)

## 2018-09-22 LAB — CBC WITH DIFFERENTIAL/PLATELET
Abs Immature Granulocytes: 0.02 10*3/uL (ref 0.00–0.07)
Basophils Absolute: 0 10*3/uL (ref 0.0–0.1)
Basophils Relative: 0 %
Eosinophils Absolute: 0.2 10*3/uL (ref 0.0–0.5)
Eosinophils Relative: 2 %
HCT: 25.9 % — ABNORMAL LOW (ref 39.0–52.0)
Hemoglobin: 7.6 g/dL — ABNORMAL LOW (ref 13.0–17.0)
Immature Granulocytes: 0 %
Lymphocytes Relative: 2 %
Lymphs Abs: 0.2 10*3/uL — ABNORMAL LOW (ref 0.7–4.0)
MCH: 19.2 pg — ABNORMAL LOW (ref 26.0–34.0)
MCHC: 29.3 g/dL — ABNORMAL LOW (ref 30.0–36.0)
MCV: 65.4 fL — ABNORMAL LOW (ref 80.0–100.0)
Monocytes Absolute: 0.8 10*3/uL (ref 0.1–1.0)
Monocytes Relative: 8 %
Neutro Abs: 8.3 10*3/uL — ABNORMAL HIGH (ref 1.7–7.7)
Neutrophils Relative %: 88 %
Platelets: 269 10*3/uL (ref 150–400)
RBC: 3.96 MIL/uL — ABNORMAL LOW (ref 4.22–5.81)
RDW: 22.7 % — ABNORMAL HIGH (ref 11.5–15.5)
WBC: 9.5 10*3/uL (ref 4.0–10.5)
nRBC: 0 % (ref 0.0–0.2)

## 2018-09-22 LAB — PATHOLOGIST SMEAR REVIEW

## 2018-09-22 LAB — LACTIC ACID, PLASMA
Lactic Acid, Venous: 2.3 mmol/L (ref 0.5–1.9)
Lactic Acid, Venous: 2.3 mmol/L (ref 0.5–1.9)

## 2018-09-22 LAB — SARS CORONAVIRUS 2 BY RT PCR (HOSPITAL ORDER, PERFORMED IN ~~LOC~~ HOSPITAL LAB): SARS Coronavirus 2: NEGATIVE

## 2018-09-22 LAB — PROCALCITONIN: Procalcitonin: 103.29 ng/mL

## 2018-09-22 MED ORDER — ACETAMINOPHEN 650 MG RE SUPP: 650.0000 mg | Freq: Four times a day (QID) | RECTAL | Status: DC | PRN

## 2018-09-22 MED ORDER — SODIUM CHLORIDE 0.9 % IV SOLN
3.0000 g | Freq: Once | INTRAVENOUS | Status: AC
  Administered 2018-09-22: 3 g via INTRAVENOUS
  Filled 2018-09-22: qty 3

## 2018-09-22 MED ORDER — TAMSULOSIN HCL 0.4 MG PO CAPS
0.4000 mg | ORAL_CAPSULE | Freq: Every day | ORAL | Status: DC
  Administered 2018-09-23 – 2018-09-24 (×2): 0.4 mg via ORAL
  Filled 2018-09-22 (×2): qty 1

## 2018-09-22 MED ORDER — HYDRALAZINE HCL 50 MG PO TABS
100.0000 mg | ORAL_TABLET | Freq: Three times a day (TID) | ORAL | Status: DC
  Administered 2018-09-23 – 2018-09-24 (×3): 100 mg via ORAL
  Filled 2018-09-22 (×3): qty 2

## 2018-09-22 MED ORDER — ISOSORBIDE MONONITRATE ER 30 MG PO TB24
30.0000 mg | ORAL_TABLET | Freq: Every day | ORAL | Status: DC
  Administered 2018-09-23 – 2018-09-24 (×2): 30 mg via ORAL
  Filled 2018-09-22 (×2): qty 1

## 2018-09-22 MED ORDER — SODIUM CHLORIDE 0.9 % IV SOLN
3.0000 g | Freq: Four times a day (QID) | INTRAVENOUS | Status: DC
  Administered 2018-09-22 – 2018-09-24 (×7): 3 g via INTRAVENOUS
  Filled 2018-09-22 (×10): qty 3

## 2018-09-22 MED ORDER — ASPIRIN 81 MG PO CHEW
81.0000 mg | CHEWABLE_TABLET | Freq: Every day | ORAL | Status: DC
  Administered 2018-09-23 – 2018-09-24 (×2): 81 mg via ORAL
  Filled 2018-09-22 (×2): qty 1

## 2018-09-22 MED ORDER — ONDANSETRON HCL 4 MG PO TABS: 4.0000 mg | ORAL_TABLET | Freq: Four times a day (QID) | ORAL | Status: DC | PRN

## 2018-09-22 MED ORDER — SODIUM CHLORIDE 0.9 % IV BOLUS
500.0000 mL | Freq: Once | INTRAVENOUS | Status: AC
  Administered 2018-09-22: 500 mL via INTRAVENOUS

## 2018-09-22 MED ORDER — FINASTERIDE 5 MG PO TABS
5.0000 mg | ORAL_TABLET | Freq: Every day | ORAL | Status: DC
  Administered 2018-09-23 – 2018-09-24 (×2): 5 mg via ORAL
  Filled 2018-09-22 (×2): qty 1

## 2018-09-22 MED ORDER — CARVEDILOL 3.125 MG PO TABS
6.2500 mg | ORAL_TABLET | Freq: Two times a day (BID) | ORAL | Status: DC
  Administered 2018-09-23 – 2018-09-24 (×2): 6.25 mg via ORAL
  Filled 2018-09-22 (×2): qty 2

## 2018-09-22 MED ORDER — IPRATROPIUM-ALBUTEROL 0.5-2.5 (3) MG/3ML IN SOLN: 3.0000 mL | Freq: Four times a day (QID) | RESPIRATORY_TRACT | Status: DC | PRN

## 2018-09-22 MED ORDER — ENOXAPARIN SODIUM 40 MG/0.4ML ~~LOC~~ SOLN
40.0000 mg | SUBCUTANEOUS | Status: DC
  Administered 2018-09-22 – 2018-09-23 (×2): 40 mg via SUBCUTANEOUS
  Filled 2018-09-22 (×2): qty 0.4

## 2018-09-22 MED ORDER — SODIUM CHLORIDE 0.9 % IV SOLN
INTRAVENOUS | Status: DC
  Administered 2018-09-22 – 2018-09-23 (×2): via INTRAVENOUS

## 2018-09-22 MED ORDER — ACETAMINOPHEN 325 MG PO TABS: 650.0000 mg | ORAL_TABLET | Freq: Four times a day (QID) | ORAL | Status: DC | PRN

## 2018-09-22 MED ORDER — SIMVASTATIN 20 MG PO TABS
40.0000 mg | ORAL_TABLET | Freq: Every day | ORAL | Status: DC
  Administered 2018-09-23: 40 mg via ORAL
  Filled 2018-09-22: qty 2

## 2018-09-22 MED ORDER — POLYETHYLENE GLYCOL 3350 17 G PO PACK: 17.0000 g | PACK | Freq: Every day | ORAL | Status: DC | PRN

## 2018-09-22 MED ORDER — POTASSIUM CHLORIDE 10 MEQ/100ML IV SOLN
10.0000 meq | INTRAVENOUS | Status: AC
  Administered 2018-09-22 – 2018-09-23 (×5): 10 meq via INTRAVENOUS
  Filled 2018-09-22 (×5): qty 100

## 2018-09-22 MED ORDER — ONDANSETRON HCL 4 MG/2ML IJ SOLN: 4.0000 mg | Freq: Four times a day (QID) | INTRAMUSCULAR | Status: DC | PRN

## 2018-09-22 NOTE — ED Notes (Signed)
Date and time results received: 09/22/18 1320 (use smartphrase ".now" to insert current time)  Test: Lactic, K+ Critical Value: Lactic 2.3, K+ 2.7  Name of Provider Notified: Dr. Quentin Cornwall  Orders Received? Or Actions Taken?: Critical Results Acknowledged

## 2018-09-22 NOTE — H&P (Signed)
Phillip [name] at [place] NAME: Phillip [name]    MR#:  [id number]  DATE OF BIRTH:  11-Feb-1927  DATE OF ADMISSION:  09/22/2018  PRIMARY CARE PHYSICIAN: [facility]   REQUESTING/REFERRING PHYSICIAN: Merlyn Lot, MD  CHIEF COMPLAINT:   Chief Complaint  Patient presents with  . Weakness    HISTORY OF PRESENT ILLNESS:  Phillip [name]  is a 85 y.o. male with a known history of hypertension, history of stroke, CAD who presented to the ED with shortness of breath and cough over the last couple of days.  He has bringing up some sputum, but he swallows it down so he does not know what color it is.  He denies any fevers or chills.  He denies any chest pain.  He states he does not have any issues with swallowing or choking on food.  Family states that they think he has possibly aspirated, as he is on a pured diet at home but refuses to eat it.  In the ED, he was hypoxic to 88% on room air and was placed on 2 L O2.  He was tachypneic with respiratory rates in the mid to high 20s.  Labs were significant for K 2.7, creatinine 1.78, lactic acid 2.3, procalcitonin 103, hemoglobin 7.6, MCV 65.  Chest x-ray showed patchy density in the right lung, consistent with bronchopneumonia versus aspiration pneumonia.  He was started on Unasyn.  Hospitalists were called for admission.  PAST MEDICAL HISTORY:   Past Medical History:  Diagnosis Date  . Benign enlargement of prostate   . Coronary artery disease   . Dysphagia   . Hypertension   . Kidney stone   . Stroke [facility])     PAST SURGICAL HISTORY:   Past Surgical History:  Procedure Laterality Date  . APPENDECTOMY    . CATARACT EXTRACTION    . COLONOSCOPY WITH PROPOFOL N/A 11/26/2015   Procedure: COLONOSCOPY WITH PROPOFOL;  Surgeon: Phillip Lame, MD;  Location: [facility] ENDOSCOPY;  Service: Endoscopy;  Laterality: N/A;  . ESOPHAGOGASTRODUODENOSCOPY (EGD) WITH PROPOFOL N/A 11/26/2015   Procedure:  ESOPHAGOGASTRODUODENOSCOPY (EGD) WITH PROPOFOL;  Surgeon: Phillip Lame, MD;  Location: [facility] ENDOSCOPY;  Service: Endoscopy;  Laterality: N/A;    SOCIAL HISTORY:   Social History   Tobacco Use  . Smoking status: Former Smoker    Packs/day: 1.00    Years: 40.00    Pack years: 40.00    Types: Cigars  . Smokeless tobacco: Never Used  Substance Use Topics  . Alcohol use: No    FAMILY HISTORY:   Family History  Problem Relation Age of Onset  . Lung cancer Father     DRUG ALLERGIES:  No Known Allergies  REVIEW OF SYSTEMS:   Review of Systems  Constitutional: Negative for chills and fever.  HENT: Negative for congestion and sore throat.   Eyes: Negative for blurred vision and double vision.  Respiratory: Positive for cough and shortness of breath. Negative for sputum production.   Cardiovascular: Negative for chest pain and palpitations.  Gastrointestinal: Negative for nausea and vomiting.  Genitourinary: Negative for dysuria and urgency.  Musculoskeletal: Negative for back pain and neck pain.  Neurological: Positive for weakness. Negative for dizziness, focal weakness and headaches.  Psychiatric/Behavioral: Negative for depression. The patient is not nervous/anxious.     MEDICATIONS AT HOME:   Prior to Admission medications   Medication Sig Start Date End Date Taking? Authorizing Provider  aspirin 81 MG chewable tablet Chew  81 mg by mouth daily.   Yes [provider]  carvedilol (COREG) 6.25 MG tablet Take 6.25 mg by mouth 2 (two) times daily with a meal.   Yes [provider]  chlorthalidone (HYGROTON) 25 MG tablet Take 25 mg by mouth daily.   Yes [provider]  finasteride (PROSCAR) 5 MG tablet Take 5 mg by mouth daily.    Yes [provider]  furosemide (LASIX) 20 MG tablet Take 20 mg by mouth every evening.   Yes [provider]  hydrALAZINE (APRESOLINE) 100 MG tablet Take 100 mg by mouth 3 (three) times daily.   Yes  [provider]  isosorbide mononitrate (IMDUR) 30 MG 24 hr tablet Take 30 mg by mouth daily.   Yes [provider]  polyethylene glycol (MIRALAX / GLYCOLAX) 17 g packet Take 17 g by mouth daily.   Yes [provider]  potassium chloride (K-DUR) 10 MEQ tablet Take 10 mEq by mouth daily.   Yes [provider]  simvastatin (ZOCOR) 40 MG tablet Take 40 mg by mouth at bedtime.   Yes [provider]  tamsulosin (FLOMAX) 0.4 MG CAPS capsule Take 0.4 mg by mouth daily.    Yes [provider]  hydrochlorothiazide (HYDRODIURIL) 25 MG tablet Take 0.5 tablets (12.5 mg total) by mouth daily. Patient not taking: Reported on 09/22/2018 03/29/15   Phillip Mango, MD  omeprazole (PRILOSEC) 20 MG capsule Take 1 capsule (20 mg total) by mouth 2 (two) times daily before a meal. Patient not taking: Reported on 09/22/2018 12/05/15   Phillip Lame, MD      VITAL SIGNS:  Blood pressure 130/78, [name] 83, temperature 99 F (37.2 C), temperature source Oral, resp. rate (!) 24, height 6\' 2"  (1.88 m), weight 90.7 kg, SpO2 97 %.  PHYSICAL EXAMINATION:  Physical Exam  GENERAL:  85 y.o.-year-old patient lying in the bed with no acute distress.  EYES: Pupils equal, round, reactive to light and accommodation. No scleral icterus. Extraocular muscles intact.  HEENT: Head atraumatic, normocephalic. Oropharynx and nasopharynx clear.  NECK:  Supple, no jugular venous distention. No thyroid enlargement, no tenderness.  LUNGS: + Focal crackles in the right lung base.  Nasal cannula in place.  No use of accessory muscles of respiration.  CARDIOVASCULAR: RRR, S1, S2 normal. No murmurs, rubs, or gallops.  ABDOMEN: Soft, nontender, nondistended. Bowel sounds present. No organomegaly or mass.  EXTREMITIES: No pedal edema, cyanosis, or clubbing.  NEUROLOGIC: Cranial nerves II through XII are intact. +global weakness. Sensation intact. Gait not checked.  PSYCHIATRIC: The patient is alert  and oriented x 3.  SKIN: No obvious rash, lesion, or ulcer.   LABORATORY PANEL:   CBC Recent Labs  Lab 09/22/18 1239  WBC 9.5  HGB 7.6*  HCT 25.9*  PLT 269   ------------------------------------------------------------------------------------------------------------------  Chemistries  Recent Labs  Lab 09/22/18 1239  NA 139  K 2.7*  CL 101  CO2 27  GLUCOSE 147*  BUN 26*  CREATININE 1.78*  CALCIUM 8.5*  AST 32  ALT 13  ALKPHOS 42  BILITOT 0.8   ------------------------------------------------------------------------------------------------------------------  Cardiac Enzymes No results for input(s): TROPONINI in the last 168 hours. ------------------------------------------------------------------------------------------------------------------  RADIOLOGY:  Dg Chest Port 1 View  Result Date: 09/22/2018 CLINICAL DATA:  Shortness of breath and weakness. Possible aspiration. EXAM: PORTABLE CHEST 1 VIEW COMPARISON:  11/06/2018 FINDINGS: Artifact overlies chest. Heart size is normal. Chronic aortic atherosclerosis and tortuosity. The left lung is clear except for mild patchy density at  the base. There is more extensive patchy density throughout the right lung. The findings could certainly be due to aspiration. No dense consolidation, lobar collapse or effusion. No acute bone finding. IMPRESSION: Widespread patchy density in the right lung. Mild patchy density at the left base. Findings are consistent with bronchopneumonia and could certainly be secondary to aspiration, as suspected clinically. Electronically Signed   By: Nelson Chimes M.D.   On: 09/22/2018 12:55      IMPRESSION AND PLAN:   Acute hypoxic respiratory failure- likely secondary to aspiration pneumonia.  Patient is supposed be on a pured diet at home, but does not follow it.  Family states he had a possible aspiration event at home.   -Continue Unasyn -Wean oxygen as able -Duonebs PRN -SLP consult -Trend  procalcitonin -Palliative care consult for goals of care discussion  Lactic acidosis- likely due to dehydration and renal insufficiency.  Patient is not meeting sepsis criteria on admission. No leukocytosis. -IV fluids -Trend lactic acid -Check UA to rule out UTI  AKI- likely due to dehydration in the setting of poor p.o. intake. -IV fluids -Avoid nephrotoxic agents -Holding home chlorthalidone and Lasix -Recheck creatinine in the morning  Hypokalemia-likely secondary to Lasix use and poor p.o. intake. -Replete and recheck  Hypertension- blood pressures normal in the ED -Continue home coreg and hydralazine  Microcytic anemia- no active bleeding. Hemoglobin is lower than baseline. -Check anemia panel and FOBT  Hyperlipidemia-stable -Continue home statin  BPH-stable -Continue home finasteride and Flomax -Renal ultrasound  All the records are reviewed and case discussed with ED provider. Management plans discussed with the patient, family and they are in agreement.  CODE STATUS: Full  TOTAL TIME TAKING CARE OF THIS PATIENT: 45 minutes.    Berna Spare Jaycion Treml M.D on 09/22/2018 at 4:42 PM  Between 7am to 6pm - Pager - [phone]  After 6pm go to [email or web address] - Proofreader  [facility]  Office  [phone]  CC: Primary care physician; [facility]   Note: This dictation was prepared with Dragon dictation along with smaller phrase technology. Any transcriptional errors that result from this process are unintentional.

## 2018-09-22 NOTE — ED Triage Notes (Signed)
Pt arrived vis [facility] for generalized weakness for several days. Possible aspiration per family. Is on pureed diet at home but refuses to eat it, also strong urine smell  per family.

## 2018-09-22 NOTE — ED Notes (Signed)
ED TO INPATIENT HANDOFF REPORT  ED Nurse Name and Phone #:  Jinny Blossom Chaplin  S Name/Age/Gender [name]. 85 y.o. male Room/Bed: ED13A/ED13A  Code Status   Code Status: Prior  Home/SNF/Other Home Patient oriented to: self, place, time and situation Is this baseline? Yes   Triage Complete: Triage complete  Chief Complaint weakness  Triage Note Pt arrived vis [facility] for generalized weakness for several days. Possible aspiration per family. Is on pureed diet at home but refuses to eat it, also strong urine smell  per family.    Allergies No Known Allergies  Level of Care/Admitting Diagnosis ED Disposition    ED Disposition Condition [facility]: [place] [[id number]]  Level of Care: Med-Surg [16]  Covid Evaluation: N/A  Diagnosis: Acute respiratory failure with hypoxia [facility]) [[id number]]  Admitting Physician: Hyman Bible DODD [[id number]]  Attending Physician: Hyman Bible DODD [[id number]]  Estimated length of stay: past midnight tomorrow  Certification:: I certify this patient will need inpatient services for at least 2 midnights  PT Class (Do Not Modify): Inpatient [101]  PT Acc Code (Do Not Modify): Private [1]       B Medical/Surgery History Past Medical History:  Diagnosis Date  . Benign enlargement of prostate   . Coronary artery disease   . Dysphagia   . Hypertension   . Kidney stone   . Stroke [facility])    Past Surgical History:  Procedure Laterality Date  . APPENDECTOMY    . CATARACT EXTRACTION    . COLONOSCOPY WITH PROPOFOL N/A 11/26/2015   Procedure: COLONOSCOPY WITH PROPOFOL;  Surgeon: [name], MD;  Location: [facility] ENDOSCOPY;  Service: Endoscopy;  Laterality: N/A;  . ESOPHAGOGASTRODUODENOSCOPY (EGD) WITH PROPOFOL N/A 11/26/2015   Procedure: ESOPHAGOGASTRODUODENOSCOPY (EGD) WITH PROPOFOL;  Surgeon: [name], MD;  Location: [facility] ENDOSCOPY;  Service: Endoscopy;  Laterality: N/A;     A IV  Location/Drains/Wounds Patient Lines/Drains/Airways Status   Active Line/Drains/Airways    Name:   Placement date:   Placement time:   Site:   Days:   Peripheral IV 09/22/18 Right Antecubital   09/22/18    1245    Antecubital   less than 1   Peripheral IV 09/22/18 Right Wrist   09/22/18    1246    Wrist   less than 1          Intake/Output Last 24 hours No intake or output data in the 24 hours ending 09/22/18 1741  Labs/Imaging Results for orders placed or performed during the hospital encounter of 09/22/18 (from the past 48 hour(s))  Lactic acid, plasma     Status: Abnormal   Collection Time: 09/22/18 12:39 PM  Result Value Ref Range   Lactic Acid, Venous 2.3 (HH) 0.5 - 1.9 mmol/L    Comment: CRITICAL RESULT CALLED TO, READ BACK BY AND VERIFIED WITH Katheen Aslin JOHNS @1316  09/22/2018 MJU Performed at [facility], [address] [place], [place] [address]   Comprehensive metabolic panel     Status: Abnormal   Collection Time: 09/22/18 12:39 PM  Result Value Ref Range   Sodium 139 135 - 145 mmol/L   Potassium 2.7 (LL) 3.5 - 5.1 mmol/L    Comment: CRITICAL RESULT CALLED TO, READ BACK BY AND VERIFIED WITH Taishaun Levels JOHNS @1320  09/22/2018 MJU    Chloride 101 98 - 111 mmol/L   CO2 27 22 - 32 mmol/L   Glucose, Bld 147 (H) 70 - 99 mg/dL   BUN  26 (H) 8 - 23 mg/dL   Creatinine, Ser 1.78 (H) 0.61 - 1.24 mg/dL   Calcium 8.5 (L) 8.9 - 10.3 mg/dL   Total Protein 6.3 (L) 6.5 - 8.1 g/dL   Albumin 3.0 (L) 3.5 - 5.0 g/dL   AST 32 15 - 41 U/L   ALT 13 0 - 44 U/L   Alkaline Phosphatase 42 38 - 126 U/L   Total Bilirubin 0.8 0.3 - 1.2 mg/dL   GFR calc non Af Amer 33 (L) >60 mL/min   GFR calc Af Amer 38 (L) >60 mL/min   Anion gap 11 5 - 15    Comment: Performed at [facility], [address] [place], [place] [address]  CBC WITH DIFFERENTIAL     Status: Abnormal   Collection Time: 09/22/18 12:39 PM  Result Value Ref Range   WBC 9.5 4.0 - 10.5 K/uL   RBC 3.96 (L) 4.22 - 5.81  MIL/uL   Hemoglobin 7.6 (L) 13.0 - 17.0 g/dL    Comment: Reticulocyte Hemoglobin testing may be clinically indicated, consider ordering this additional test [id number]    HCT 25.9 (L) 39.0 - 52.0 %   MCV 65.4 (L) 80.0 - 100.0 fL   MCH 19.2 (L) 26.0 - 34.0 pg   MCHC 29.3 (L) 30.0 - 36.0 g/dL   RDW 22.7 (H) 11.5 - 15.5 %   Platelets 269 150 - 400 K/uL   nRBC 0.0 0.0 - 0.2 %   Neutrophils Relative % 88 %   Neutro Abs 8.3 (H) 1.7 - 7.7 K/uL   Lymphocytes Relative 2 %   Lymphs Abs 0.2 (L) 0.7 - 4.0 K/uL   Monocytes Relative 8 %   Monocytes Absolute 0.8 0.1 - 1.0 K/uL   Eosinophils Relative 2 %   Eosinophils Absolute 0.2 0.0 - 0.5 K/uL   Basophils Relative 0 %   Basophils Absolute 0.0 0.0 - 0.1 K/uL   WBC Morphology MORPHOLOGY UNREMARKABLE    Immature Granulocytes 0 %   Abs Immature Granulocytes 0.02 0.00 - 0.07 K/uL   Schistocytes PRESENT    Dimorphism PRESENT    Polychromasia PRESENT    Giant PLTs PRESENT     Comment: Performed at [facility], [address] [place], [place] [address]  Procalcitonin     Status: None   Collection Time: 09/22/18 12:39 PM  Result Value Ref Range   Procalcitonin 103.29 ng/mL    Comment:        Interpretation: PCT >= 10 ng/mL: Important systemic inflammatory response, almost exclusively due to severe bacterial sepsis or septic shock. (NOTE)       Sepsis PCT Algorithm           Lower Respiratory Tract                                      Infection PCT Algorithm    ----------------------------     ----------------------------         PCT < 0.25 ng/mL                PCT < 0.10 ng/mL         Strongly encourage             Strongly discourage   discontinuation of antibiotics    initiation of antibiotics    ----------------------------     -----------------------------       PCT 0.25 -  0.50 ng/mL            PCT 0.10 - 0.25 ng/mL               OR       >80% decrease in PCT            Discourage initiation of                                             antibiotics      Encourage discontinuation           of antibiotics    ----------------------------     -----------------------------         PCT >= 0.50 ng/mL              PCT 0.26 - 0.50 ng/mL                AND       <80% decrease in PCT             Encourage initiation of                                             antibiotics       Encourage continuation           of antibiotics    ----------------------------     -----------------------------        PCT >= 0.50 ng/mL                  PCT > 0.50 ng/mL               AND         increase in PCT                  Strongly encourage                                      initiation of antibiotics    Strongly encourage escalation           of antibiotics                                     -----------------------------                                           PCT <= 0.25 ng/mL                                                 OR                                        > 80% decrease in PCT  Discontinue / Do not initiate                                             antibiotics Performed at [facility], [address] [place], [place] [address]   SARS Coronavirus 2 [facility] order, Performed in [place] hospital lab)     Status: None   Collection Time: 09/22/18 12:39 PM  Result Value Ref Range   SARS Coronavirus 2 NEGATIVE NEGATIVE    Comment: (NOTE) If result is NEGATIVE SARS-CoV-2 target nucleic acids are NOT DETECTED. The SARS-CoV-2 RNA is generally detectable in upper and lower  respiratory specimens during the acute phase of infection. The lowest  concentration of SARS-CoV-2 viral copies this assay can detect is 250  copies / mL. A negative result does not preclude SARS-CoV-2 infection  and should not be used as the sole basis for treatment or other  patient management decisions.  A negative result may occur with  improper specimen collection / handling, submission  of specimen other  than nasopharyngeal swab, presence of viral mutation(s) within the  areas targeted by this assay, and inadequate number of viral copies  (<250 copies / mL). A negative result must be combined with clinical  observations, patient history, and epidemiological information. If result is POSITIVE SARS-CoV-2 target nucleic acids are DETECTED. The SARS-CoV-2 RNA is generally detectable in upper and lower  respiratory specimens dur ing the acute phase of infection.  Positive  results are indicative of active infection with SARS-CoV-2.  Clinical  correlation with patient history and other diagnostic information is  necessary to determine patient infection status.  Positive results do  not rule out bacterial infection or co-infection with other viruses. If result is PRESUMPTIVE POSTIVE SARS-CoV-2 nucleic acids MAY BE PRESENT.   A presumptive positive result was obtained on the submitted specimen  and confirmed on repeat testing.  While 2019 novel coronavirus  (SARS-CoV-2) nucleic acids may be present in the submitted sample  additional confirmatory testing may be necessary for epidemiological  and / or clinical management purposes  to differentiate between  SARS-CoV-2 and other Sarbecovirus currently known to infect humans.  If clinically indicated additional testing with an alternate test  methodology [phone]) is advised. The SARS-CoV-2 RNA is generally  detectable in upper and lower respiratory sp ecimens during the acute  phase of infection. The expected result is Negative. Fact Sheet for Patients:  StrictlyIdeas.no Fact Sheet for Healthcare Providers: BankingDealers.co.za This test is not yet approved or cleared by the [place] FDA and has been authorized for detection and/or diagnosis of SARS-CoV-2 by FDA under an Emergency Use Authorization (EUA).  This EUA will remain in effect (meaning this test can be used) for  the duration of the COVID-19 declaration under Section 564(b)(1) of the Act, 21 U.S.C. section 360bbb-3(b)(1), unless the authorization is terminated or revoked sooner. Performed at [facility], [address], [place], [place] [address]   Pathologist smear review     Status: None   Collection Time: 09/22/18 12:39 PM  Result Value Ref Range   Path Review Blood smear and history reviewed.     Comment: Platelets are adequate. There is severe microcytic anemia with marked anisocytosis and poikilocytosis, morphologically consistent with severe iron deficiency anemia. There is mild reactive neutrophilic leukocytosis with slight left shift. No abnormal or  immature leukocytes are identified. Reviewed  by Lemmie Evens. Dicie Beam, MD. Performed at [facility], [address], [place], [place] [address]    Dg Chest Port 1 View  Result Date: 09/22/2018 CLINICAL DATA:  Shortness of breath and weakness. Possible aspiration. EXAM: PORTABLE CHEST 1 VIEW COMPARISON:  11/06/2018 FINDINGS: Artifact overlies chest. Heart size is normal. Chronic aortic atherosclerosis and tortuosity. The left lung is clear except for mild patchy density at the base. There is more extensive patchy density throughout the right lung. The findings could certainly be due to aspiration. No dense consolidation, lobar collapse or effusion. No acute bone finding. IMPRESSION: Widespread patchy density in the right lung. Mild patchy density at the left base. Findings are consistent with bronchopneumonia and could certainly be secondary to aspiration, as suspected clinically. Electronically Signed   By: Nelson Chimes M.D.   On: 09/22/2018 12:55    Pending Labs Unresulted Labs (From admission, onward)    Start     Ordered   09/22/18 1239  Lactic acid, plasma  STAT Now then every 3 hours,   STAT     09/22/18 1238   09/22/18 1239  Blood Culture (routine x 2)  BLOOD CULTURE X 2,   STAT     09/22/18 1238   09/22/18 1239  Urine  culture  ONCE - STAT,   STAT     09/22/18 1238   09/22/18 1239  Urinalysis, Complete w Microscopic  ONCE - STAT,   STAT     09/22/18 1238   Signed and Held  Basic metabolic panel  Tomorrow morning,   R     Signed and Held   Signed and Held  CBC  Tomorrow morning,   R     Signed and Held   Signed and Held  Ferritin  Tomorrow morning,   R     Signed and Held   Signed and Held  Iron and TIBC  Tomorrow morning,   R     Signed and Held   Visual merchandiser and Held  Vitamin B12  Tomorrow morning,   R     Signed and Held   Signed and Held  Folate  Tomorrow morning,   R     Signed and Held   Signed and Held  Occult blood card to lab, stool RN will collect  Once,   R    Question:  Specimen to be collected by?  Answer:  RN will collect   Signed and Held   Signed and Held  Procalcitonin  Tomorrow morning,   R     Signed and Held          Vitals/Pain Today's Vitals   09/22/18 1601 09/22/18 1602 09/22/18 1700 09/22/18 1730  BP:   120/80 133/68  Pulse:  83 80 80  Resp: 19 (!) 24 (!) 26 (!) 22  Temp:      TempSrc:      SpO2:  97% 93% 99%  Weight:      Height:      PainSc:        Isolation Precautions No active isolations  Medications Medications  Ampicillin-Sulbactam (UNASYN) 3 g in sodium chloride 0.9 % 100 mL IVPB (has no administration in time range)  Ampicillin-Sulbactam (UNASYN) 3 g in sodium chloride 0.9 % 100 mL IVPB (0 g Intravenous Stopped 09/22/18 1434)    Mobility walks with person assist High fall risk   Focused Assessments See focused assessment   R Recommendations: See Admitting Provider Note  Report given to:  Additional Notes:  Admission for aspiration pneumonia

## 2018-09-22 NOTE — ED Notes (Signed)
Pt resting in bed. NAD noted at this time. Pt readjusted in bed. Denies any further needs. Will continue to monitor. Lights dimmed for patient comfort.

## 2018-09-22 NOTE — ED Provider Notes (Signed)
[facility] Emergency Department Provider Note    First MD Initiated Contact with Patient 09/22/18 1222     (approximate)  I have reviewed the triage vital signs and the nursing notes.   HISTORY  Chief Complaint Weakness    HPI Phillip [name]. is a 85 y.o. male extensive past medical history as listed below presents the ER for fever weakness wet productive cough over the past 24 hours.  Family called EMS was concerned that he had been aspirating.  He states to be eating only pured liquid diet.  He denies any particular pain but states he has been having thick cough and does feel short of breath and weak.  Patient was found to be hypoxic on room air requiring supplemental oxygen.  Does not wear oxygen at home.    Past Medical History:  Diagnosis Date  . Benign enlargement of prostate   . Coronary artery disease   . Dysphagia   . Hypertension   . Kidney stone   . Stroke [facility])    Family History  Problem Relation Age of Onset  . Lung cancer Father    Past Surgical History:  Procedure Laterality Date  . APPENDECTOMY    . CATARACT EXTRACTION    . COLONOSCOPY WITH PROPOFOL N/A 11/26/2015   Procedure: COLONOSCOPY WITH PROPOFOL;  Surgeon: [name], MD;  Location: [facility] ENDOSCOPY;  Service: Endoscopy;  Laterality: N/A;  . ESOPHAGOGASTRODUODENOSCOPY (EGD) WITH PROPOFOL N/A 11/26/2015   Procedure: ESOPHAGOGASTRODUODENOSCOPY (EGD) WITH PROPOFOL;  Surgeon: [name], MD;  Location: [facility] ENDOSCOPY;  Service: Endoscopy;  Laterality: N/A;   Patient Active Problem List   Diagnosis Date Noted  . Urinary retention 01/26/2017  . AKI (acute kidney injury) ([place]) 01/19/2017  . Hypertensive encephalopathy 01/18/2017  . Malignant hypertension 01/18/2017  . Overlapping malignant neoplasm of colon ([place]) 01/18/2017  . Recurrent aspiration pneumonia ([place]) 01/17/2017  . Risk for falls 03/12/2016  . Colon cancer ([place]) 02/14/2016  . CVA (cerebral vascular  accident) ([place]) 02/14/2016  . Diastolic heart failure ([place]) 02/14/2016  . Fall 02/14/2016  . Hypertensive emergency 02/14/2016  . Right eyelid laceration 02/14/2016  . Blood in stool   . Gastritis   . Neoplasm of digestive system   . Benign neoplasm of transverse colon   . Calculus of kidney 11/18/2015  . BP (high blood pressure) 11/18/2015  . Arteriosclerosis of coronary artery 11/18/2015  . Benign fibroma of prostate 11/18/2015  . Gastrointestinal bleeding, lower 11/15/2015  . Anemia, iron deficiency 11/15/2015  . Lower gastrointestinal bleed   . Rectal bleeding   . GIB (gastrointestinal bleeding) 11/07/2015  . GI bleed 11/07/2015  . HCAP (healthcare-associated pneumonia) 03/27/2015  . Gastroenteritis 02/23/2015  . Nausea with vomiting 02/23/2015  . Pneumonia 02/21/2015  . Bleeding from the nose 01/24/2014  . Can't get food down 01/24/2014      Prior to Admission medications   Medication Sig Start Date End Date Taking? Authorizing Provider  acetaminophen (TYLENOL) 500 MG tablet Take 1,000 mg by mouth. 02/18/16   [provider]  alfuzosin (UROXATRAL) 10 MG 24 hr tablet Take 10 mg by mouth at bedtime.    [provider]  aspirin EC 81 MG tablet Take 81 mg by mouth.    [provider]  cloNIDine (CATAPRES) 0.1 MG tablet  11/25/16   [provider]  cloNIDine (CATAPRES) 0.2 MG tablet Take 0.2 mg by mouth 2 (two) times daily.    [provider]  finasteride (PROSCAR) 5  MG tablet Take 5 mg by mouth.    [provider]  fluticasone (FLONASE) 50 MCG/ACT nasal spray Two sprays in each nostril once daily. 12/11/16   [provider]  furosemide (LASIX) 20 MG tablet Take 20 mg by mouth every evening.    [provider]  hydrALAZINE (APRESOLINE) 100 MG tablet Take 100 mg by mouth 3 (three) times daily.    [provider]  hydrochlorothiazide (HYDRODIURIL) 25 MG tablet Take 0.5 tablets (12.5 mg total) by mouth  daily. Patient taking differently: Take 25 mg by mouth daily.  03/29/15   Nicholes Mango, MD  isosorbide mononitrate (IMDUR) 30 MG 24 hr tablet Take 30 mg by mouth daily.    [provider]  metoprolol (LOPRESSOR) 50 MG tablet Take 50 mg by mouth 2 (two) times daily.    [provider]  metoprolol succinate (TOPROL-XL) 50 MG 24 hr tablet Take 50 mg by mouth.    [provider]  omeprazole (PRILOSEC) 20 MG capsule Take 1 capsule (20 mg total) by mouth 2 (two) times daily before a meal. 12/05/15   [name], MD  simvastatin (ZOCOR) 40 MG tablet Take 40 mg by mouth at bedtime.    [provider]  tamsulosin (FLOMAX) 0.4 MG CAPS capsule Take 0.4 mg by mouth.    [provider]    Allergies Patient has no known allergies.    Social History Social History   Tobacco Use  . Smoking status: Former Smoker    Packs/day: 1.00    Years: 40.00    Pack years: 40.00    Types: Cigars  . Smokeless tobacco: Never Used  Substance Use Topics  . Alcohol use: No  . Drug use: No    Review of Systems Patient denies headaches, rhinorrhea, blurry vision, numbness, shortness of breath, chest pain, edema, cough, abdominal pain, nausea, vomiting, diarrhea, dysuria, fevers, rashes or hallucinations unless otherwise stated above in HPI. ____________________________________________   PHYSICAL EXAM:  VITAL SIGNS: Vitals:   09/22/18 1230 09/22/18 1233  BP:  131/81  Pulse:  95  Temp:  99 F (37.2 C)  SpO2: 100% (!) 88%    Constitutional: Alert and oriented. Ill and frail appearing Eyes: Conjunctivae are normal.  Head: Atraumatic. Nose: No congestion/rhinnorhea. Mouth/Throat: Mucous membranes are moist.   Neck: No stridor. Painless ROM.  Cardiovascular: Normal rate, regular rhythm. Grossly normal heart sounds.  Good peripheral circulation. Respiratory: Normal respiratory effort.  No retractions. Lungs with coarse rhonchi in right lower lung fields  Gastrointestinal: Soft and nontender. No distention. No abdominal bruits. No CVA tenderness. Genitourinary: deferred Musculoskeletal: No lower extremity tenderness nor edema.  No joint effusions. Neurologic: No new gross focal neurologic deficits are appreciated.  Skin:  Skin is warm, dry and intact. No rash noted. Psychiatric: Mood and affect are normal. Speech and behavior are normal.  ____________________________________________   LABS (all labs ordered are listed, but only abnormal results are displayed)  No results found for this or any previous visit (from the past 24 hour(s)). ____________________________________________  EKG My review and personal interpretation at Time: 12:24   Indication: sob  Rate: 100  Rhythm: sinus Axis: normal Other: normal intervals, non specific st abn ____________________________________________  RADIOLOGY  I personally reviewed all radiographic images ordered to evaluate for the above acute complaints and reviewed radiology reports and findings.  These findings were personally discussed with the patient.  Please see medical record for radiology report.  ____________________________________________   PROCEDURES  Procedure(s) performed:  .  Critical Care Performed by: Merlyn Lot, MD Authorized by: Merlyn Lot, MD   Critical care provider statement:    Critical care time (minutes):  30   Critical care time was exclusive of:  Separately billable procedures and treating other patients   Critical care was necessary to treat or prevent imminent or life-threatening deterioration of the following conditions:  Respiratory failure   Critical care was time spent personally by me on the following activities:  Development of treatment plan with patient or surrogate, discussions with consultants, evaluation of patient's response to treatment, examination of patient, obtaining history from patient or surrogate, ordering and performing treatments  and interventions, ordering and review of laboratory studies, ordering and review of radiographic studies, pulse oximetry, re-evaluation of patient's condition and review of old charts      Critical Care performed: yes ____________________________________________   INITIAL IMPRESSION / [place] / ED COURSE  Pertinent labs & imaging results that were available during my care of the patient were reviewed by me and considered in my medical decision making (see chart for details).   DDX: Asthma, copd, CHF, pna, ptx, malignancy, Pe, anemia   Phillip [name]. is a 85 y.o. who presents to the ED with symptoms as described above.   Patient hypoxic requiring supplemental oxygen.  Patient ill-appearing.  The patient will be placed on continuous pulse oximetry and telemetry for monitoring.  Laboratory evaluation will be sent to evaluate for the above complaints.     Clinical Course as of Sep 22 1598  Thu Sep 22, 2018  1332 Chest x-ray consistent with probable aspiration.  Will give dose of Unasyn.   [PR]    Clinical Course User Index [PR] Merlyn Lot, MD   The patient was evaluated in Emergency Department today for the symptoms described in the history of present illness. He/she was evaluated in the context of the global COVID-19 pandemic, which necessitated consideration that the patient might be at risk for infection with the SARS-CoV-2 virus that causes COVID-19. Institutional protocols and algorithms that pertain to the evaluation of patients at risk for COVID-19 are in a state of rapid change based on information released by regulatory bodies including the CDC and federal and state organizations. These policies and algorithms were followed during the patient's care in the ED.   As part of my medical decision making, I reviewed the following data within the [facility] notes reviewed and incorporated, Labs reviewed, notes from prior ED visits and  [place] Controlled Substance Database   ____________________________________________   FINAL CLINICAL IMPRESSION(S) / ED DIAGNOSES  Final diagnoses:  Acute respiratory failure with hypoxia (HCC)  Aspiration pneumonia of both lungs, unspecified aspiration pneumonia type, unspecified part of lung ([place])      NEW MEDICATIONS STARTED DURING THIS VISIT:  New Prescriptions   No medications on file     Note:  This document was prepared using Dragon voice recognition software and may include unintentional dictation errors.    Merlyn Lot, MD 09/22/18 [phone]

## 2018-09-22 NOTE — Progress Notes (Signed)
Pharmacy Antibiotic Note  Phillip [name]. is a 85 y.o. male admitted on 09/22/2018 with asp. pneumonia.  Pharmacy has been consulted for Unasyn dosing.  Plan: Will order Unasyn 3 gram IV q6h.   Height: 6\' 2"  (188 cm) Weight: 200 lb (90.7 kg) IBW/kg (Calculated) : 82.2  Temp (24hrs), Avg:99 F (37.2 C), Min:99 F (37.2 C), Max:99 F (37.2 C)  Recent Labs  Lab 09/22/18 1239  WBC 9.5  CREATININE 1.78*  LATICACIDVEN 2.3*    Estimated Creatinine Clearance: 32.7 mL/min (A) (by C-G formula based on SCr of 1.78 mg/dL (H)).    No Known Allergies  Antimicrobials this admission: Unasyn 5/7 >>       >>    Dose adjustments this admission:    Microbiology results: 5/7 BCx: pending 5/7 UCx: pending    Sputum:      MRSA PCR:   5/7 covid negative  Thank you for allowing pharmacy to be a part of this patient's care.  Anihya Tuma A 09/22/2018 5:23 PM

## 2018-09-22 NOTE — ED Notes (Signed)
Attempted to cath pt w/o success. Pt soaked in urine prior to attempt.

## 2018-09-22 NOTE — Progress Notes (Addendum)
Family Meeting Note  Advance Directive:no  Today a meeting took place with the Patient.  Patient is able to participate,  The following clinical team members were present during this meeting:MD  The following were discussed:Patient's diagnosis: aspiration pneumonia, Patient's progosis: Unable to determine and Goals for treatment: DNI  Additional follow-up to be provided: prn- palliative care consult placed for goals of care discussion  Time spent during discussion:20 minutes  Evette Doffing, MD

## 2018-09-23 ENCOUNTER — Inpatient Hospital Stay: Payer: Medicare Other

## 2018-09-23 DIAGNOSIS — N179 Acute kidney failure, unspecified: Secondary | ICD-10-CM

## 2018-09-23 DIAGNOSIS — Z515 Encounter for palliative care: Secondary | ICD-10-CM

## 2018-09-23 DIAGNOSIS — J9601 Acute respiratory failure with hypoxia: Secondary | ICD-10-CM

## 2018-09-23 DIAGNOSIS — J690 Pneumonitis due to inhalation of food and vomit: Principal | ICD-10-CM

## 2018-09-23 LAB — FERRITIN: Ferritin: 17 ng/mL — ABNORMAL LOW (ref 24–336)

## 2018-09-23 LAB — CBC
HCT: 22.8 % — ABNORMAL LOW (ref 39.0–52.0)
Hemoglobin: 6.5 g/dL — ABNORMAL LOW (ref 13.0–17.0)
MCH: 19.1 pg — ABNORMAL LOW (ref 26.0–34.0)
MCHC: 28.5 g/dL — ABNORMAL LOW (ref 30.0–36.0)
MCV: 67.1 fL — ABNORMAL LOW (ref 80.0–100.0)
Platelets: 216 10*3/uL (ref 150–400)
RBC: 3.4 MIL/uL — ABNORMAL LOW (ref 4.22–5.81)
RDW: 23 % — ABNORMAL HIGH (ref 11.5–15.5)
WBC: 10.2 10*3/uL (ref 4.0–10.5)
nRBC: 0 % (ref 0.0–0.2)

## 2018-09-23 LAB — URINALYSIS, COMPLETE (UACMP) WITH MICROSCOPIC
Bilirubin Urine: NEGATIVE
Glucose, UA: NEGATIVE mg/dL
Hgb urine dipstick: NEGATIVE
Ketones, ur: NEGATIVE mg/dL
Nitrite: NEGATIVE
Protein, ur: NEGATIVE mg/dL
Specific Gravity, Urine: 1.02 (ref 1.005–1.030)
pH: 5 (ref 5.0–8.0)

## 2018-09-23 LAB — BASIC METABOLIC PANEL
Anion gap: 10 (ref 5–15)
BUN: 29 mg/dL — ABNORMAL HIGH (ref 8–23)
CO2: 27 mmol/L (ref 22–32)
Calcium: 7.8 mg/dL — ABNORMAL LOW (ref 8.9–10.3)
Chloride: 103 mmol/L (ref 98–111)
Creatinine, Ser: 1.52 mg/dL — ABNORMAL HIGH (ref 0.61–1.24)
GFR calc Af Amer: 46 mL/min — ABNORMAL LOW (ref >60)
GFR calc non Af Amer: 40 mL/min — ABNORMAL LOW (ref >60)
Glucose, Bld: 119 mg/dL — ABNORMAL HIGH (ref 70–99)
Potassium: 3 mmol/L — ABNORMAL LOW (ref 3.5–5.1)
Sodium: 140 mmol/L (ref 135–145)

## 2018-09-23 LAB — IRON AND TIBC
Iron: 7 ug/dL — ABNORMAL LOW (ref 45–182)
Saturation Ratios: 3 % — ABNORMAL LOW (ref 17.9–39.5)
TIBC: 282 ug/dL (ref 250–450)
UIBC: 275 ug/dL

## 2018-09-23 LAB — PREPARE RBC (CROSSMATCH)

## 2018-09-23 LAB — HEMOGLOBIN AND HEMATOCRIT, BLOOD
HCT: 28.4 % — ABNORMAL LOW (ref 39.0–52.0)
Hemoglobin: 8.4 g/dL — ABNORMAL LOW (ref 13.0–17.0)

## 2018-09-23 LAB — VITAMIN B12: Vitamin B-12: 70 pg/mL — ABNORMAL LOW (ref 180–914)

## 2018-09-23 LAB — LACTIC ACID, PLASMA: Lactic Acid, Venous: 1.7 mmol/L (ref 0.5–1.9)

## 2018-09-23 LAB — FOLATE: Folate: 12.2 ng/mL (ref >5.9)

## 2018-09-23 LAB — PROCALCITONIN: Procalcitonin: 95.27 ng/mL

## 2018-09-23 MED ORDER — SODIUM CHLORIDE 0.9% IV SOLUTION
Freq: Once | INTRAVENOUS | Status: AC
  Administered 2018-09-23: 16:00:00 via INTRAVENOUS

## 2018-09-23 MED ORDER — POTASSIUM CHLORIDE CRYS ER 20 MEQ PO TBCR
40.0000 meq | EXTENDED_RELEASE_TABLET | Freq: Once | ORAL | Status: AC
  Administered 2018-09-23: 14:00:00 40 meq via ORAL
  Filled 2018-09-23: qty 2

## 2018-09-23 MED ORDER — POLYETHYLENE GLYCOL 3350 17 G PO PACK
17.0000 g | PACK | Freq: Every day | ORAL | Status: DC
  Administered 2018-09-23 – 2018-09-24 (×2): 17 g via ORAL
  Filled 2018-09-23 (×2): qty 1

## 2018-09-23 NOTE — Plan of Care (Signed)
  Problem: Health Behavior/Discharge Planning: Goal: Ability to manage health-related needs will improve Outcome: Progressing   Problem: Clinical Measurements: Goal: Ability to maintain clinical measurements within normal limits will improve Outcome: Progressing Goal: Diagnostic test results will improve Outcome: Progressing Goal: Respiratory complications will improve Outcome: Progressing   Problem: Nutrition: Goal: Adequate nutrition will be maintained Outcome: Progressing   Problem: Coping: Goal: Level of anxiety will decrease Outcome: Progressing   Problem: Elimination: Goal: Will not experience complications related to bowel motility Outcome: Progressing   Problem: Pain Managment: Goal: General experience of comfort will improve Outcome: Progressing   Problem: Safety: Goal: Ability to remain free from injury will improve Outcome: Progressing   Problem: Skin Integrity: Goal: Risk for impaired skin integrity will decrease Outcome: Progressing

## 2018-09-23 NOTE — Progress Notes (Signed)
Pharmacy Antibiotic Note  Phillip [name]. is a 85 y.o. male admitted on 09/22/2018 with asp. pneumonia.  Pharmacy has been consulted for Unasyn dosing.  Plan: Will continue Unasyn 3 gram IV q6h.   Height: 6\' 2"  (188 cm) Weight: 184 lb 1.4 oz (83.5 kg) IBW/kg (Calculated) : 82.2  Temp (24hrs), Avg:98.3 F (36.8 C), Min:97.6 F (36.4 C), Max:99 F (37.2 C)  Recent Labs  Lab 09/22/18 1239 09/22/18 1909 09/23/18 0032  WBC 9.5  --  10.2  CREATININE 1.78*  --  1.52*  LATICACIDVEN 2.3* 2.3* 1.7    Estimated Creatinine Clearance: 38.3 mL/min (A) (by C-G formula based on SCr of 1.52 mg/dL (H)).    No Known Allergies  Antimicrobials this admission: Unasyn 5/7 >>     Dose adjustments this admission:  N/A  Microbiology results: 5/7 BCx: NG <24 hours  5/7 UCx: pending   5/7 covid negative  Thank you for allowing pharmacy to be a part of this patient's care.  Rowland Lathe 09/23/2018 9:14 AM

## 2018-09-23 NOTE — Evaluation (Signed)
Clinical/Bedside Swallow Evaluation Patient Details  Name: Phillip [name]. MRN: [id number] Date of Birth: Oct 06, 1926  Today's Date: 09/23/2018 Time: SLP Start Time (ACUTE ONLY): 1410 SLP Stop Time (ACUTE ONLY): 1500 SLP Time Calculation (min) (ACUTE ONLY): 50 min  Past Medical History:  Past Medical History:  Diagnosis Date  . Benign enlargement of prostate   . Coronary artery disease   . Dysphagia   . Hypertension   . Kidney stone   . Stroke [facility])    Past Surgical History:  Past Surgical History:  Procedure Laterality Date  . APPENDECTOMY    . CATARACT EXTRACTION    . COLONOSCOPY WITH PROPOFOL N/A 11/26/2015   Procedure: COLONOSCOPY WITH PROPOFOL;  Surgeon: [name], MD;  Location: [facility] ENDOSCOPY;  Service: Endoscopy;  Laterality: N/A;  . ESOPHAGOGASTRODUODENOSCOPY (EGD) WITH PROPOFOL N/A 11/26/2015   Procedure: ESOPHAGOGASTRODUODENOSCOPY (EGD) WITH PROPOFOL;  Surgeon: [name], MD;  Location: [facility] ENDOSCOPY;  Service: Endoscopy;  Laterality: N/A;   HPI:  Pt is a 85 y.o. male with a known history of hypertension, history of CVA, Dysphagia, CAD who presented to the ED with shortness of breath and cough over the last couple of days.  He has bringing up some sputum.  He denies any chest pain.  He states he does not have any issues with swallowing or choking on food though he has a h/o Dysphagia - Family states that they think he has possibly aspirated, as he is on a pured diet at home but refuses to eat it.  CXR revealed: "Widespread patchy density in the right lung. Mild patchy density at the left base. Findings are consistent with bronchopneumonia and could certainly be secondary to aspiration, as suspected clinically",  In the ED, he was hypoxic to 88% on room air and was placed on 2 L O2.  He was tachypneic with respiratory rates in the mid to high 20s.    Assessment / Plan / Recommendation Clinical Impression  Pt appeared to present w/ oropharyngeal phase dysphagia during  trials presented at evaluation despite aspiration precautions utilized; risk for aspiration and pulmonary decline are increased. Pt has a known h/o pharyngeal phase dysphagia and was recomemnded Nectar consistency liquids last admission ~3 yrs ago. Per chart/family notes, pt is noncompliant w/ a dysphagia diet of thickened liquids at home. During assessment, he exhibited delayed throat clearing w/ trials of ice chips; no trials of thin liquids were attempted. When presented Nectar consistency liquids via Cup w/ instruction of small, single sips, he exhibited both immediate and delayed coughing and throat clearing w/ congested coughing. This presentation was NOT noted during trials of HONEY consistency liquids via Spoon which he fed himself. Pt appeared to better tolerate this consistency of liquid w/out overt s/s of aspiration; no multiple swallows in attempts to fully clear boluses and clear vocal quality post trials. No immediate, overt deficits were noted w/ trials of purees though w/ soft solids, pt exhibited throat clearing and multiple swallows were noted. Min oral phase deficits c/b increased mastication time and bolus management observed - suspect could be impact from the missing dentition. Pt appears at increased risk for aspiration w/ liquids and would benefit from a Dysphagia diet (puree initially) w/ HONEY liquids diet at this time; Pills in Puree for safer swallowing or Honey liquids. Recommend aspiration precautions; trials to upgrade food consistency in diet as appropriate. Noted Palliative Care consult. NSG updated.  SLP Visit Diagnosis: Dysphagia, oropharyngeal phase (R13.12)(lacking dentition impacting oral phase)    Aspiration Risk  Moderate aspiration risk;Risk for inadequate nutrition/hydration    Diet Recommendation  Dysphagia level 1 w/ HONEY consistency liquids; aspiration precautions; tray setup at meals; monitoring at meals  Medication Administration: Whole meds with puree(or  Crushed in Puree as needed for safer swallowing)    Other  Recommendations Recommended Consults: (Dietician f/u; Palliative Care f/u) Oral Care Recommendations: Oral care BID;Staff/trained caregiver to provide oral care Other Recommendations: Order thickener from pharmacy;Prohibited food (jello, ice cream, thin soups);Remove water pitcher;Have oral suction available   Follow up Recommendations (TBD)      Frequency and Duration min 3x week  2 weeks       Prognosis Prognosis for Safe Diet Advancement: Fair Barriers to Reach Goals: Motivation;Time post onset;Severity of deficits      Swallow Study   General Date of Onset: 09/22/18 HPI: Pt is a 85 y.o. male with a known history of hypertension, history of CVA, Dysphagia, CAD who presented to the ED with shortness of breath and cough over the last couple of days.  He has bringing up some sputum.  He denies any chest pain.  He states he does not have any issues with swallowing or choking on food though he has a h/o Dysphagia - Family states that they think he has possibly aspirated, as he is on a pured diet at home but refuses to eat it.  CXR revealed: "Widespread patchy density in the right lung. Mild patchy density at the left base. Findings are consistent with bronchopneumonia and could certainly be secondary to aspiration, as suspected clinically",  In the ED, he was hypoxic to 88% on room air and was placed on 2 L O2.  He was tachypneic with respiratory rates in the mid to high 20s.  Type of Study: Bedside Swallow Evaluation Previous Swallow Assessment: 2015(at [facility] per report); 2016([facility]) Diet Prior to this Study: Dysphagia 3 (soft);Nectar-thick liquids(last admission here at [facility] in 2016, per SLP) Temperature Spikes Noted: No(wbc 10.2) Respiratory Status: Nasal cannula(2-3 liters) History of Recent Intubation: No Behavior/Cognition: Alert;Cooperative;Pleasant mood(HOH!) Oral Cavity Assessment: Dry Oral Care Completed by SLP: Yes Oral  Cavity - Dentition: Poor condition;Missing dentition(many) Vision: Functional for self-feeding Self-Feeding Abilities: Able to feed self;Needs assist;Needs set up(Left handed) Patient Positioning: Upright in bed(needed positioning assistance) Baseline Vocal Quality: Normal Volitional Cough: Strong;Congested(min) Volitional Swallow: Able to elicit(time)    Oral/Motor/Sensory Function Overall Oral Motor/Sensory Function: (grossly WFL overall)   Ice Chips Ice chips: Impaired Presentation: Spoon(fed; 3 trials) Oral Phase Impairments: (good mastication) Pharyngeal Phase Impairments: Throat Clearing - Delayed(x2)   Thin Liquid Thin Liquid: Not tested Other Comments: baseline dysphagia w/ thin liquids    Nectar Thick Nectar Thick Liquid: Impaired Presentation: Cup;Self Fed(~3-4 ozs total) Oral Phase Impairments: (none) Oral phase functional implications: (none) Pharyngeal Phase Impairments: Cough - Immediate;Cough - Delayed;Throat Clearing - Immediate;Throat Clearing - Delayed(x5-6 for throat clearing; x2-3 for coughing)   Honey Thick Honey Thick Liquid: Within functional limits Presentation: Self fed;Spoon(8 trials)   Puree Puree: Within functional limits Presentation: Self Fed;Spoon(~4 ozs)   Solid     Solid: Impaired Presentation: Self Fed;Spoon(5 trials) Oral Phase Impairments: Impaired mastication(missing dentition) Oral Phase Functional Implications: Prolonged oral transit Pharyngeal Phase Impairments: Throat Clearing - Delayed;Cough - Delayed;Multiple swallows(x1 each) Other Comments: appeared less coordinated w/ bolus management during swallowing/clearing      Orinda Kenner, MS, CCC-SLP Tyreanna Bisesi 09/23/2018,3:41 PM

## 2018-09-23 NOTE — Progress Notes (Signed)
Dr Estanislado Pandy aware hgb 6.5

## 2018-09-23 NOTE — Consult Note (Addendum)
Consultation Note Date: 09/23/2018   Patient Name: Phillip [name].  DOB: [birth year]  MRN: [id number]  Age / Sex: 85 y.o., male  PCP: [facility], [place] [facility] Referring Physician: Saundra Shelling, MD  Reason for Consultation: Establishing goals of care  HPI/Patient Profile: Phillip [name]  is a 85 y.o. male with a known history of hypertension, history of stroke, CAD who presented to the ED with shortness of breath and cough over the last couple of days.  Clinical Assessment and Goals of Care: Patient is resting in bed. He can tell me his name, the month, the president. He cannot tell me the year or the hospital. He lives at home with his wife. He has 3 living children.   He states he is not familiar with a special diet he is supposed to be on, but does state his wife tries to give him food he does not want to eat. We discussed his swallowing. We discussed his diagnosis, prognosis, GOC, EOL wishes disposition and options.  A detailed discussion was had today regarding advanced directives.  Concepts specific to code status, artifical feeding and hydration, IV antibiotics and rehospitalization were discussed.  The difference between an aggressive medical intervention path and a comfort care path was discussed.  Values and goals of care important to patient and family were attempted to be elicited.  Discussed limitations of medical interventions to prolong quality of life for this patient at this time in this situation and discussed the concept of human mortality.  He states he would never want a feeding tube. He states he would never want to be placed on a ventilator or life support. He states "I've been blessed to live almost 90 years. I'm not worried about living or dying. If God takes me home that'd be okay." He states he does not know if he would want CPR, although it would attempt to prolong his life. We  discussed how CPR and a ventilator work together in succession. He states he does not want to return to the hospital after this discharge. We discussed hospice. He is okay with me speaking with family as he states he is not sure what to do after discharge.   Spoke with his wife and 1 daughter on speaker phone. They state he knows he is supposed to be on a dysphagia diet, and is aware of the consequences. They state he refuses to eat his approved diet. They state he was admitted about a year ago and had blood in the stool and required blood transfusion. They state he decided against operative management of his colon CA. We discussed hospice. Family wants him to complete the current hospitalization, treating the treatable with treatment such as blood transfusion, IVF, electrolytes. Family wants to speak to him about hospice once he is home as he has not recently had PNA even eating as he wants.   Recommend palliative at D/C.  His family agrees that once he comes home, if he is amenable at that time, he would likely benefit from hospice  in the home.             SUMMARY OF RECOMMENDATIONS   Recommend palliative at D/C.   Code Status/Advance Care Planning:  Limited code   Prognosis:   Unable to determine  Discharge Planning: To Be Determined      Primary Diagnoses: Present on Admission: . Acute respiratory failure with hypoxia ([place])   I have reviewed the medical record, interviewed the patient and family, and examined the patient. The following aspects are pertinent.  Past Medical History:  Diagnosis Date  . Benign enlargement of prostate   . Coronary artery disease   . Dysphagia   . Hypertension   . Kidney stone   . Stroke [facility])    Social History   Socioeconomic History  . Marital status: Married    Spouse name: Not on file  . Number of children: Not on file  . Years of education: Not on file  . Highest education level: Not on file  Occupational History  . Not on file   Social Needs  . Financial resource strain: Not on file  . Food insecurity:    Worry: Not on file    Inability: Not on file  . Transportation needs:    Medical: Not on file    Non-medical: Not on file  Tobacco Use  . Smoking status: Former Smoker    Packs/day: 1.00    Years: 40.00    Pack years: 40.00    Types: Cigars  . Smokeless tobacco: Never Used  Substance and Sexual Activity  . Alcohol use: No  . Drug use: No  . Sexual activity: Not on file  Lifestyle  . Physical activity:    Days per week: Not on file    Minutes per session: Not on file  . Stress: Not on file  Relationships  . Social connections:    Talks on phone: Not on file    Gets together: Not on file    Attends religious service: Not on file    Active member of club or organization: Not on file    Attends meetings of clubs or organizations: Not on file    Relationship status: Not on file  Other Topics Concern  . Not on file  Social History Narrative  . Not on file   Family History  Problem Relation Age of Onset  . Lung cancer Father    Scheduled Meds: . sodium chloride   Intravenous Once  . aspirin  81 mg Oral Daily  . carvedilol  6.25 mg Oral BID WC  . enoxaparin (LOVENOX) injection  40 mg Subcutaneous Q24H  . finasteride  5 mg Oral Daily  . hydrALAZINE  100 mg Oral TID  . isosorbide mononitrate  30 mg Oral Daily  . polyethylene glycol  17 g Oral Daily  . potassium chloride  40 mEq Oral Once  . simvastatin  40 mg Oral QHS  . tamsulosin  0.4 mg Oral Daily   Continuous Infusions: . sodium chloride 75 mL/hr at 09/23/18 0416  . ampicillin-sulbactam (UNASYN) IV 3 g (09/23/18 0907)   PRN Meds:.acetaminophen **OR** acetaminophen, ipratropium-albuterol, ondansetron **OR** ondansetron (ZOFRAN) IV Medications Prior to Admission:  Prior to Admission medications   Medication Sig Start Date End Date Taking? Authorizing Provider  aspirin 81 MG chewable tablet Chew 81 mg by mouth daily.   Yes [provider]  carvedilol (COREG) 6.25 MG tablet Take 6.25 mg by mouth 2 (two) times daily with a meal.  Yes [provider]  chlorthalidone (HYGROTON) 25 MG tablet Take 25 mg by mouth daily.   Yes [provider]  finasteride (PROSCAR) 5 MG tablet Take 5 mg by mouth daily.    Yes [provider]  furosemide (LASIX) 20 MG tablet Take 20 mg by mouth every evening.   Yes [provider]  hydrALAZINE (APRESOLINE) 100 MG tablet Take 100 mg by mouth 3 (three) times daily.   Yes [provider]  isosorbide mononitrate (IMDUR) 30 MG 24 hr tablet Take 30 mg by mouth daily.   Yes [provider]  polyethylene glycol (MIRALAX / GLYCOLAX) 17 g packet Take 17 g by mouth daily.   Yes [provider]  potassium chloride (K-DUR) 10 MEQ tablet Take 10 mEq by mouth daily.   Yes [provider]  simvastatin (ZOCOR) 40 MG tablet Take 40 mg by mouth at bedtime.   Yes [provider]  tamsulosin (FLOMAX) 0.4 MG CAPS capsule Take 0.4 mg by mouth daily.    Yes [provider]  hydrochlorothiazide (HYDRODIURIL) 25 MG tablet Take 0.5 tablets (12.5 mg total) by mouth daily. Patient not taking: Reported on 09/22/2018 03/29/15   Nicholes Mango, MD  omeprazole (PRILOSEC) 20 MG capsule Take 1 capsule (20 mg total) by mouth 2 (two) times daily before a meal. Patient not taking: Reported on 09/22/2018 12/05/15   [name], MD   No Known Allergies Review of Systems  All other systems reviewed and are negative.   Physical Exam Pulmonary:     Effort: Pulmonary effort is normal.  Skin:    General: Skin is warm and dry.  Neurological:     Mental Status: He is alert.     Vital Signs: BP 140/72 (BP Location: Left Arm)   Pulse 73   Temp 97.6 F (36.4 C) (Oral)   Resp 19   Ht 6\' 2"  (1.88 m)   Wt 83.5 kg   SpO2 100%   BMI 23.64 kg/m  Pain Scale: 0-10   Pain Score: 0-No pain   SpO2: SpO2: 100 % O2 Device:SpO2: 100 % O2 Flow  Rate: .O2 Flow Rate (L/min): 3 L/min  IO: Intake/output summary:   Intake/Output Summary (Last 24 hours) at 09/23/2018 1343 Last data filed at 09/23/2018 0940 Gross per 24 hour  Intake 1118.69 ml  Output 600 ml  Net 518.69 ml    LBM: Last BM Date: 09/21/18(per pt) Baseline Weight: Weight: 90.7 kg Most recent weight: Weight: 83.5 kg     Palliative Assessment/Data:   Flowsheet Rows     Most Recent Value  Intake Tab  Referral Department  Hospitalist  Unit at Time of Referral  ER  Date Notified  09/22/18  Palliative Care Type  New Palliative care  Reason for referral  Clarify Goals of Care  Date of Admission  09/22/18  # of days IP prior to Palliative referral  0  Clinical Assessment  Psychosocial & Spiritual Assessment  Palliative Care Outcomes      Time In: 11:00 Time Out: 150 min Greater than 50%  of this time was spent counseling and coordinating care related to the above assessment and plan.  Signed by: Asencion Gowda, NP   Please contact Palliative Medicine Team phone at [phone] for questions and concerns.  For individual provider: See Shea Evans

## 2018-09-23 NOTE — Progress Notes (Signed)
PT Cancellation Note  Patient Details Name: Phillip [name]. MRN: [id number] DOB: 17-Sep-1926   Cancelled Treatment:    Reason Eval/Treat Not Completed: Medical issues which prohibited therapy(Noted anemia, will hold PT evaluation until Hb is at a safe level to participate in OOB/exertional activity. )  8:59 AM, 09/23/18 Etta Grandchild, PT, DPT Physical Therapist - [facility]  [phone] ([place])   Hasson Gaspard C 09/23/2018, 8:59 AM

## 2018-09-23 NOTE — Progress Notes (Signed)
[facility] [place] NAME: Phillip [name]    MR#:  [id number]  DATE OF BIRTH:  February 27, 1927  SUBJECTIVE:  CHIEF COMPLAINT:   Chief Complaint  Patient presents with  . Weakness    REVIEW OF SYSTEMS:    ROS  CONSTITUTIONAL: No documented fever. No fatigue, weakness. No weight gain, no weight loss.  EYES: No blurry or double vision.  ENT: No tinnitus. No postnasal drip. No redness of the oropharynx.  RESPIRATORY: No cough, no wheeze, no hemoptysis. No dyspnea.  CARDIOVASCULAR: No chest pain. No orthopnea. No palpitations. No syncope.  GASTROINTESTINAL: No nausea, no vomiting or diarrhea. No abdominal pain. No melena or hematochezia.  GENITOURINARY: No dysuria or hematuria.  ENDOCRINE: No polyuria or nocturia. No heat or cold intolerance.  HEMATOLOGY: No anemia. No bruising. No bleeding.  INTEGUMENTARY: No rashes. No lesions.  MUSCULOSKELETAL: No arthritis. No swelling. No gout.  NEUROLOGIC: No numbness, tingling, or ataxia. No seizure-type activity.  PSYCHIATRIC: No anxiety. No insomnia. No ADD.   DRUG ALLERGIES:  No Known Allergies  VITALS:  Blood pressure 140/72, pulse 73, temperature 97.6 F (36.4 C), temperature source Oral, resp. rate 19, height 6\' 2"  (1.88 m), weight 83.5 kg, SpO2 100 %.  PHYSICAL EXAMINATION:   Physical Exam  GENERAL:  85 y.o.-year-old patient lying in the bed with no acute distress.  EYES: Pupils equal, round, reactive to light and accommodation. No scleral icterus. Extraocular muscles intact.  HEENT: Head atraumatic, normocephalic. Oropharynx and nasopharynx clear.  NECK:  Supple, no jugular venous distention. No thyroid enlargement, no tenderness.  LUNGS: Decreased breath sounds bilaterally, rales heard in left lung. No use of accessory muscles of respiration.  CARDIOVASCULAR: S1, S2 normal. No murmurs, rubs, or gallops.  ABDOMEN: Soft, nontender, nondistended. Bowel sounds present. No organomegaly or  mass.  EXTREMITIES: No cyanosis, clubbing or edema b/l.    NEUROLOGIC: Cranial nerves II through XII are intact. No focal Motor or sensory deficits b/l.   PSYCHIATRIC: The patient is alert and oriented x 3.  SKIN: No obvious rash, lesion, or ulcer.   LABORATORY PANEL:   CBC Recent Labs  Lab 09/23/18 0032  WBC 10.2  HGB 6.5*  HCT 22.8*  PLT 216   ------------------------------------------------------------------------------------------------------------------ Chemistries  Recent Labs  Lab 09/22/18 1239 09/23/18 0032  NA 139 140  K 2.7* 3.0*  CL 101 103  CO2 27 27  GLUCOSE 147* 119*  BUN 26* 29*  CREATININE 1.78* 1.52*  CALCIUM 8.5* 7.8*  AST 32  --   ALT 13  --   ALKPHOS 42  --   BILITOT 0.8  --    ------------------------------------------------------------------------------------------------------------------  Cardiac Enzymes No results for input(s): TROPONINI in the last 168 hours. ------------------------------------------------------------------------------------------------------------------  RADIOLOGY:  US Renal  Result Date: 09/23/2018 CLINICAL DATA:  Acute renal injury EXAM: RENAL / URINARY TRACT ULTRASOUND COMPLETE COMPARISON:  3 FINDINGS: Right Kidney: Renal measurements: 9.9 x 4.5 x 5.7 cm = volume: There is 132 mL . Echogenicity within normal limits. No mass or hydronephrosis visualized. Left Kidney: Renal measurements: 12.4 x 5.9 x 6.3 cm = volume: 244 mL. No hydronephrosis. Two anechoic cysts measuring 1.5 cm. Bladder: Small bladder diverticulum. Mild bladder wall thickening. Enlarged prostate gland equals 118 cubic cm. IMPRESSION: 1. Normal kidneys. No hydronephrosis. Benign-appearing LEFT renal cysts. 2. Small bladder diverticulum.  Large prostate gland. Electronically Signed   By: Suzy Bouchard M.D.   On: 09/23/2018 09:27   Dg Chest [facility] 1 [address]  Result Date: 09/22/2018 CLINICAL DATA:  Shortness of breath and weakness. Possible aspiration. EXAM:  PORTABLE CHEST 1 VIEW COMPARISON:  11/06/2018 FINDINGS: Artifact overlies chest. Heart size is normal. Chronic aortic atherosclerosis and tortuosity. The left lung is clear except for mild patchy density at the base. There is more extensive patchy density throughout the right lung. The findings could certainly be due to aspiration. No dense consolidation, lobar collapse or effusion. No acute bone finding. IMPRESSION: Widespread patchy density in the right lung. Mild patchy density at the left base. Findings are consistent with bronchopneumonia and could certainly be secondary to aspiration, as suspected clinically. Electronically Signed   By: Nelson Chimes M.D.   On: 09/22/2018 12:55     ASSESSMENT AND PLAN:    85 year old male patient with history of hypertension, CVA, coronary artery disease under hospitalist service for shortness of breath and low oxygen  -Hypoxia secondary to aspiration pneumonia Oxygen via nasal cannula  -Aspiration pneumonia Continue IV Unasyn antibiotic Follow-up cultures  -Symptomatic anemia Transfuse 1 unit PRBC IV Will be from renal disease  -Acute kidney injury IV fluids Hold nephrotoxic medication Follow up renal function   -Acute hypokalemia Replace potassium  -Benign prostate hypertrophy Continue finasteride and Flomax Renal ultrasound reviewed  All the records are reviewed and case discussed with Care Management/Social Worker. Management plans discussed with the patient, family and they are in agreement.  CODE STATUS: Partial code  DVT Prophylaxis: SCDs  TOTAL TIME TAKING CARE OF THIS PATIENT: 38 minutes.   POSSIBLE D/C IN 3 to 4 DAYS, DEPENDING ON CLINICAL CONDITION.  Saundra Shelling M.D on 09/23/2018 at 11:42 AM  Between 7am to 6pm - Pager - [phone]  After 6pm go to [email or web address] - password [email or web address] [facility]  Office  [phone]  CC: Primary care physician; [facility]  Note: This dictation was  prepared with Dragon dictation along with smaller phrase technology. Any transcriptional errors that result from this process are unintentional.

## 2018-09-24 LAB — CBC
HCT: 26.9 % — ABNORMAL LOW (ref 39.0–52.0)
Hemoglobin: 7.8 g/dL — ABNORMAL LOW (ref 13.0–17.0)
MCH: 19.7 pg — ABNORMAL LOW (ref 26.0–34.0)
MCHC: 29 g/dL — ABNORMAL LOW (ref 30.0–36.0)
MCV: 67.9 fL — ABNORMAL LOW (ref 80.0–100.0)
Platelets: 224 10*3/uL (ref 150–400)
RBC: 3.96 MIL/uL — ABNORMAL LOW (ref 4.22–5.81)
RDW: 23.9 % — ABNORMAL HIGH (ref 11.5–15.5)
WBC: 9.3 10*3/uL (ref 4.0–10.5)
nRBC: 0 % (ref 0.0–0.2)

## 2018-09-24 LAB — BPAM RBC
Blood Product Expiration Date: 202005192359
ISSUE DATE / TIME: 202005081530
Unit Type and Rh: 5100

## 2018-09-24 LAB — TYPE AND SCREEN
ABO/RH(D): O POS
Antibody Screen: NEGATIVE
Unit division: 0

## 2018-09-24 LAB — BASIC METABOLIC PANEL
Anion gap: 8 (ref 5–15)
BUN: 23 mg/dL (ref 8–23)
CO2: 28 mmol/L (ref 22–32)
Calcium: 8.1 mg/dL — ABNORMAL LOW (ref 8.9–10.3)
Chloride: 108 mmol/L (ref 98–111)
Creatinine, Ser: 0.98 mg/dL (ref 0.61–1.24)
GFR calc Af Amer: >60 mL/min (ref >60)
GFR calc non Af Amer: >60 mL/min (ref >60)
Glucose, Bld: 117 mg/dL — ABNORMAL HIGH (ref 70–99)
Potassium: 3.6 mmol/L (ref 3.5–5.1)
Sodium: 144 mmol/L (ref 135–145)

## 2018-09-24 MED ORDER — VITAMIN B-12 1000 MCG PO TABS: 1000.0000 ug | ORAL_TABLET | Freq: Every day | ORAL | Status: DC

## 2018-09-24 MED ORDER — CYANOCOBALAMIN 1000 MCG PO TABS: 1000.0000 ug | ORAL_TABLET | Freq: Every day | ORAL | 0 refills | Status: AC

## 2018-09-24 MED ORDER — FERROUS SULFATE 325 (65 FE) MG PO TABS: 325.0000 mg | ORAL_TABLET | Freq: Every day | ORAL | 0 refills | Status: AC

## 2018-09-24 MED ORDER — SODIUM CHLORIDE 0.9 % IV SOLN
510.0000 mg | Freq: Once | INTRAVENOUS | Status: DC
  Filled 2018-09-24: qty 17

## 2018-09-24 MED ORDER — AMOXICILLIN-POT CLAVULANATE 875-125 MG PO TABS: 1.0000 | ORAL_TABLET | Freq: Two times a day (BID) | ORAL | 0 refills | Status: AC

## 2018-09-24 MED ORDER — CYANOCOBALAMIN 1000 MCG/ML IJ SOLN
1000.0000 ug | Freq: Once | INTRAMUSCULAR | Status: DC
  Filled 2018-09-24: qty 1

## 2018-09-24 NOTE — Evaluation (Signed)
Physical Therapy Evaluation Patient Details Name: Phillip [name]. MRN: [id number] DOB: [birth year] Today's Date: 09/24/2018   History of Present Illness  Patient is a 85 y/o M presenting to ED 09/22/18 for fever, weakness, and wet productive cough. Required O2 in ED, does not wear O2 at home. PMH of HTN, stroke, dysphagia (pureed liquid diet ONLY), and CAD. Lives at home with wife, completes ADLs ind with and without AD, daughter lives near by and completes errand running and cooking/cleaning.   Clinical Impression  Patient demonstrates ind bed mobility, and modI STS transfer (requiring x2 attempts to initiate). O2 weaned to RA (from 2L) in sitting and standing, maintaining mid 90s throughout. Pt modI over 78ft of ambulation PT cued patient through RW negotiation and taking larger strides to aid in foot clearance, with good carry over. PT started pt on 2L O2 and weaned down to RA throughout ambulation with 1 instance of O2 at 85% with quick return to 90% or above with PLB. Cuing and demo for PLB throughout ambulation. Following return to room and patient sitting for a couple mins, pulse ox began reading 77% ( from 91%), PT put O2 on 5L to attempt to quickly return O2 to normal sat with PLB steadily inc to 82%, then jumped to 96%. Unknown if this was d/t mechanical error or true reading as pt was asymptomatic throughout. PT continued education on PLB and importance of normal O2 sats on organ and body function; pt verbalized understanding. Patient would benefit from HHPT for continued O2 monitoring with activity, adherence of energy conservation techniques, and fall risk reduction. Would benefit from skilled PT to address above deficits and promote optimal return to PLOF    Follow Up Recommendations Home health PT    Equipment Recommendations  Rolling walker with 5" wheels    Recommendations for Other Services Rehab consult     Precautions / Restrictions Precautions Precautions:  None Restrictions Weight Bearing Restrictions: No      Mobility  Bed Mobility Overal bed mobility: Independent                Transfers Overall transfer level: Needs assistance   Transfers: Sit to/from Stand Sit to Stand: Modified independent (Device/Increase time)         General transfer comment: Patient able to stand without AD with x2 attempts to rise  Ambulation/Gait Ambulation/Gait assistance: Modified independent (Device/Increase time) Gait Distance (Feet): 75 Feet Assistive device: Rolling walker (2 wheeled) Gait Pattern/deviations: Decreased stride length Gait velocity: decreased   General Gait Details: Patient with decreased stride length bilat, able to correct with cuing. PT weaned patient's O2 down throughout ambulation. Patient able to wean to RA with O2 touching 85% momentarily with patietn requiring pause and cuing for pursed lip breathing to elevate O2. Throughout ambulation patient needing for breathing technique  Stairs            Wheelchair Mobility    Modified Rankin (Stroke Patients Only)       Balance Overall balance assessment: Needs assistance Sitting-balance support: No upper extremity supported;Feet unsupported Sitting balance-Leahy Scale: Normal       Standing balance-Leahy Scale: Good Standing balance comment: Reports fall a week ago when getting out of bed to go to restroom                             Pertinent Vitals/Pain Pain Assessment: No/denies pain    Home Living Family/patient expects to be  discharged to:: Private residence Living Arrangements: Spouse/significant other Available Help at Discharge: Family Type of Home: House Home Access: Stairs to enter Entrance Stairs-Rails: Right Entrance Stairs-Number of Steps: 4 Home Layout: Able to live on main level with bedroom/bathroom Home Equipment: [place] - single point;Walker - 2 wheels Additional Comments: Reports he utilizes both cane and walker "at  times"    Prior Function Level of Independence: Independent with assistive device(s)         Comments: Patient is able to bathe, dress, feed himself. Reports daughter that lives by drives him to appts, picks up groceries/medication, and helps with heavier cleaning and cooking at the house     Hand Dominance   Dominant Hand: Right    Extremity/Trunk Assessment   Upper Extremity Assessment Upper Extremity Assessment: Overall WFL for tasks assessed    Lower Extremity Assessment Lower Extremity Assessment: Overall WFL for tasks assessed    Cervical / Trunk Assessment Cervical / Trunk Assessment: Normal  Communication   Communication: No difficulties  Cognition Arousal/Alertness: Awake/alert Behavior During Therapy: WFL for tasks assessed/performed Overall Cognitive Status: Within Functional Limits for tasks assessed                                        General Comments      Exercises Other Exercises Other Exercises: Supine> sit patient able to complete with no cuing, other than to attempt without use of bed rails Other Exercises: Sitting EOB patient able to demonstrate good balance and posture following min cuing, O2 wean with patient able to maintain O2 in mid-upper 90s weaned to RA over 53mins Other Exercises: STS: pt able to complete STS pushing off with bilat extremities from bed x2 attempts, O2 monitored with standing over a couple mins maintaining 94% Other Exercises: Ambulation: with RW PT cued patient through RW negotiation and taking larger strides to aid in foot clearance. PT started pt on 2L O2 and weaned down to RA throughout ambulation with 1 instance of O2 at 85% with quick return to 90% or above with PLB. Cuing and demo for PLB throughout ambulation. Following min cuing for controlled sit and pulse ox began reading 77% ( from 91%), PT put O2 on 5L to attempt to quickly return O2 to normal sat with PLB steadily inc to 82%, then jumped to 96%.  Unknown if this was d/t mechanical error or true reading as pt was asymptomatic throughout. PT continued education on PLB and importance of normal O2 sats on organ and body function; pt verbalized understanding.    Assessment/Plan    PT Assessment Patient needs continued PT services  PT Problem List Decreased strength;Decreased balance;Decreased mobility;Decreased activity tolerance       PT Treatment Interventions DME instruction;Functional mobility training;Balance training;Patient/family education;Gait training;Therapeutic activities;Neuromuscular re-education;Stair training;Therapeutic exercise    PT Goals (Current goals can be found in the Care Plan section)  Acute Rehab PT Goals Patient Stated Goal: Return home with family  PT Goal Formulation: With patient Time For Goal Achievement: 10/08/18 Potential to Achieve Goals: Fair    Frequency Min 2X/week   Barriers to discharge        Co-evaluation               AM-PAC PT "6 Clicks" Mobility  Outcome Measure Help needed turning from your back to your side while in a flat bed without using bedrails?: None Help needed moving  from lying on your back to sitting on the side of a flat bed without using bedrails?: None Help needed moving to and from a bed to a chair (including a wheelchair)?: None Help needed standing up from a chair using your arms (e.g., wheelchair or bedside chair)?: A Little Help needed to walk in hospital room?: A Little Help needed climbing 3-5 steps with a railing? : A Little 6 Click Score: 21    End of Session Equipment Utilized During Treatment: Gait belt;Oxygen Activity Tolerance: Patient tolerated treatment well;Treatment limited secondary to medical complications (Comment) Patient left: with call bell/phone within reach;in chair;with chair alarm set Nurse Communication: Mobility status PT Visit Diagnosis: Unsteadiness on feet (R26.81);Other abnormalities of gait and mobility (R26.89);History of  falling (Z91.81);Muscle weakness (generalized) (M62.81);Difficulty in walking, not elsewhere classified (R26.2)    Time: 5747-3403 PT Time Calculation (min) (ACUTE ONLY): 43 min   Charges:   PT Evaluation $PT Eval Moderate Complexity: 1 Mod PT Treatments $Gait Training: 8-22 mins $Therapeutic Activity: 23-37 mins      Shelton Silvas PT, DPT  Shelton Silvas 09/24/2018, 10:40 AM

## 2018-09-24 NOTE — TOC Transition Note (Signed)
Transition of Care [facility]) - CM/SW Discharge Note   Patient Details  Name: Phillip [name]. MRN: [id number] Date of Birth: 1927-01-31  [facility]) CM/SW Contact:  Latanya Maudlin, RN Phone Number: 09/24/2018, 10:39 AM   Clinical Narrative:  Patient to be discharged per MD order. Orders in place for home health services. Patient lives at home with spouse and daughter. Patient has all needed DME, family agreeable to outpatient palliative, referral faxed to [place]. CMS Medicare.gov Compare Post Acute Care list reviewed with patient and family, they prefer to uses advanced home care again, referral placed with [facility].      Final next level of care: Home w Home Health Services Barriers to Discharge: No Barriers Identified   Patient Goals and CMS Choice   CMS Medicare.gov Compare Post Acute Care list provided to:: Patient Represenative (must comment)(daughter, Katharine Look) Choice offered to / list presented to : Patient, Adult Children, Spouse  Discharge Placement                       Discharge Plan and Services                          HH Arranged: PT, RN [facility] Agency: [place] ([facility]) Date [facility] Agency Contacted: 09/24/18 Time [place]: 1039 Representative spoke with at [place]: [place] ([place]) Interventions     Readmission Risk Interventions No flowsheet data found.

## 2018-09-24 NOTE — Discharge Summary (Addendum)
[place] [facility] [place] NAME: Phillip [name]    MR#:  [id number]  DATE OF BIRTH:  12-13-1926  DATE OF ADMISSION:  09/22/2018 ADMITTING PHYSICIAN: Sela Hua, MD  DATE OF DISCHARGE: 09/24/2018  PRIMARY CARE PHYSICIAN: [facility] [place] [facility]    ADMISSION DIAGNOSIS:  Acute respiratory failure with hypoxia (HCC) [J96.01] Aspiration pneumonia of both lungs, unspecified aspiration pneumonia type, unspecified part of lung ([place]) [J69.0]  DISCHARGE DIAGNOSIS:  Active Problems:   Acute respiratory failure with hypoxia ([place])   SECONDARY DIAGNOSIS:   Past Medical History:  Diagnosis Date  . Benign enlargement of prostate   . Coronary artery disease   . Dysphagia   . Hypertension   . Kidney stone   . Stroke [facility])     HOSPITAL COURSE:   85 year old male with history of hypertension and CAD who presented to the emergency room due to cough and shortness of breath.  1.  Acute hypoxic respiratory failure in the setting of aspiration pneumonia: Patient was treated with Unasyn.  He will be discharged on oral Augmentin.  He had speech evaluation which does confirm aspiration.  He is on aspiration precautions and dysphagia 1 diet.  2.  Severe iron and B12 deficiency: Anemia panel consistent with severe iron deficiency as well as low B12.  Patient received IV iron.  He also received IM B12.  He will continue with iron supplementation as well as B12 supplementation. He was transfused 1 unit PRBC with stable hemoglobin.  3.  Acute kidney injury in the setting of poor p.o. intake and pneumonia: Patient's creatinine is at baseline.  He may resume all outpatient home medications.  4.  Hypokalemia: Potassium was repleted  5.  BPH: Continue finasteride and Flomax Renal ultrasound did not show evidence of hydronephrosis  6.  Essential hypertension: Patient resume outpatient regimen including hydralazine and isosorbide  7.  Hyperlipidemia: Continue  statin  He would benefit from outpatient palliative care services.  DISCHARGE CONDITIONS AND DIET:  Stable Regular diet  CONSULTS OBTAINED:    DRUG ALLERGIES:  No Known Allergies  DISCHARGE MEDICATIONS:   Allergies as of 09/24/2018   No Known Allergies     Medication List    STOP taking these medications   hydrochlorothiazide 25 MG tablet Commonly known as:  HYDRODIURIL   omeprazole 20 MG capsule Commonly known as:  PRILOSEC     TAKE these medications   amoxicillin-clavulanate 875-125 MG tablet Commonly known as:  Augmentin Take 1 tablet by mouth 2 (two) times daily.   aspirin 81 MG chewable tablet Chew 81 mg by mouth daily.   carvedilol 6.25 MG tablet Commonly known as:  COREG Take 6.25 mg by mouth 2 (two) times daily with a meal.   chlorthalidone 25 MG tablet Commonly known as:  HYGROTON Take 25 mg by mouth daily.   cyanocobalamin 1000 MCG tablet Take 1 tablet (1,000 mcg total) by mouth daily. Start taking on:  Sep 25, 2018   ferrous sulfate 325 (65 FE) MG tablet Commonly known as:  FerrouSul Take 1 tablet (325 mg total) by mouth daily with breakfast. Start taking on:  Sep 25, 2018   finasteride 5 MG tablet Commonly known as:  PROSCAR Take 5 mg by mouth daily.   furosemide 20 MG tablet Commonly known as:  LASIX Take 20 mg by mouth every evening.   hydrALAZINE 100 MG tablet Commonly known as:  APRESOLINE Take 100 mg by mouth 3 (three) times daily.  isosorbide mononitrate 30 MG 24 hr tablet Commonly known as:  IMDUR Take 30 mg by mouth daily.   polyethylene glycol 17 g packet Commonly known as:  MIRALAX / GLYCOLAX Take 17 g by mouth daily.   potassium chloride 10 MEQ tablet Commonly known as:  K-DUR Take 10 mEq by mouth daily.   simvastatin 40 MG tablet Commonly known as:  ZOCOR Take 40 mg by mouth at bedtime.   tamsulosin 0.4 MG Caps capsule Commonly known as:  FLOMAX Take 0.4 mg by mouth daily.         Today   CHIEF  COMPLAINT:  Doing better    VITAL SIGNS:  Blood pressure (!) 164/93, pulse 83, temperature 98.2 F (36.8 C), temperature source Oral, resp. rate 19, height 6\' 2"  (1.88 m), weight 83.5 kg, SpO2 100 %.   REVIEW OF SYSTEMS:  Review of Systems  Constitutional: Negative.  Negative for chills, fever and malaise/fatigue.  HENT: Negative.  Negative for ear discharge, ear pain, hearing loss, nosebleeds and sore throat.   Eyes: Negative.  Negative for blurred vision and pain.  Respiratory: Positive for cough. Negative for hemoptysis, shortness of breath and wheezing.   Cardiovascular: Negative.  Negative for chest pain, palpitations and leg swelling.  Gastrointestinal: Negative.  Negative for abdominal pain, blood in stool, diarrhea, nausea and vomiting.  Genitourinary: Negative.  Negative for dysuria.  Musculoskeletal: Negative.  Negative for back pain.  Skin: Negative.   Neurological: Negative for dizziness, tremors, speech change, focal weakness, seizures and headaches.  Endo/Heme/Allergies: Negative.  Does not bruise/bleed easily.  Psychiatric/Behavioral: Negative.  Negative for depression, hallucinations and suicidal ideas.     PHYSICAL EXAMINATION:  GENERAL:  85 y.o.-year-old patient lying in the bed with no acute distress.  NECK:  Supple, no jugular venous distention. No thyroid enlargement, no tenderness.  LUNGS: Normal breath sounds bilaterally, no wheezing, rales,rhonchi  No use of accessory muscles of respiration.  CARDIOVASCULAR: S1, S2 normal. No murmurs, rubs, or gallops.  ABDOMEN: Soft, non-tender, non-distended. Bowel sounds present. No organomegaly or mass.  EXTREMITIES: No pedal edema, cyanosis, or clubbing.  PSYCHIATRIC: The patient is alert and oriented x 3.  SKIN: No obvious rash, lesion, or ulcer.   DATA REVIEW:   CBC Recent Labs  Lab 09/24/18 0421  WBC 9.3  HGB 7.8*  HCT 26.9*  PLT 224    Chemistries  Recent Labs  Lab 09/22/18 1239  09/24/18 0421  NA  139   < > 144  K 2.7*   < > 3.6  CL 101   < > 108  CO2 27   < > 28  GLUCOSE 147*   < > 117*  BUN 26*   < > 23  CREATININE 1.78*   < > 0.98  CALCIUM 8.5*   < > 8.1*  AST 32  --   --   ALT 13  --   --   ALKPHOS 42  --   --   BILITOT 0.8  --   --    < > = values in this interval not displayed.    Cardiac Enzymes No results for input(s): TROPONINI in the last 168 hours.  Microbiology Results  @MICRORSLT48 @  RADIOLOGY:  US Renal  Result Date: 09/23/2018 CLINICAL DATA:  Acute renal injury EXAM: RENAL / URINARY TRACT ULTRASOUND COMPLETE COMPARISON:  3 FINDINGS: Right Kidney: Renal measurements: 9.9 x 4.5 x 5.7 cm = volume: There is 132 mL . Echogenicity within normal limits. No mass or  hydronephrosis visualized. Left Kidney: Renal measurements: 12.4 x 5.9 x 6.3 cm = volume: 244 mL. No hydronephrosis. Two anechoic cysts measuring 1.5 cm. Bladder: Small bladder diverticulum. Mild bladder wall thickening. Enlarged prostate gland equals 118 cubic cm. IMPRESSION: 1. Normal kidneys. No hydronephrosis. Benign-appearing LEFT renal cysts. 2. Small bladder diverticulum.  Large prostate gland. Electronically Signed   By: Suzy Bouchard M.D.   On: 09/23/2018 09:27   Dg Chest Port 1 View  Result Date: 09/22/2018 CLINICAL DATA:  Shortness of breath and weakness. Possible aspiration. EXAM: PORTABLE CHEST 1 VIEW COMPARISON:  11/06/2018 FINDINGS: Artifact overlies chest. Heart size is normal. Chronic aortic atherosclerosis and tortuosity. The left lung is clear except for mild patchy density at the base. There is more extensive patchy density throughout the right lung. The findings could certainly be due to aspiration. No dense consolidation, lobar collapse or effusion. No acute bone finding. IMPRESSION: Widespread patchy density in the right lung. Mild patchy density at the left base. Findings are consistent with bronchopneumonia and could certainly be secondary to aspiration, as suspected clinically.  Electronically Signed   By: Nelson Chimes M.D.   On: 09/22/2018 12:55      Allergies as of 09/24/2018   No Known Allergies     Medication List    STOP taking these medications   hydrochlorothiazide 25 MG tablet Commonly known as:  HYDRODIURIL   omeprazole 20 MG capsule Commonly known as:  PRILOSEC     TAKE these medications   amoxicillin-clavulanate 875-125 MG tablet Commonly known as:  Augmentin Take 1 tablet by mouth 2 (two) times daily.   aspirin 81 MG chewable tablet Chew 81 mg by mouth daily.   carvedilol 6.25 MG tablet Commonly known as:  COREG Take 6.25 mg by mouth 2 (two) times daily with a meal.   chlorthalidone 25 MG tablet Commonly known as:  HYGROTON Take 25 mg by mouth daily.   cyanocobalamin 1000 MCG tablet Take 1 tablet (1,000 mcg total) by mouth daily. Start taking on:  Sep 25, 2018   ferrous sulfate 325 (65 FE) MG tablet Commonly known as:  FerrouSul Take 1 tablet (325 mg total) by mouth daily with breakfast. Start taking on:  Sep 25, 2018   finasteride 5 MG tablet Commonly known as:  PROSCAR Take 5 mg by mouth daily.   furosemide 20 MG tablet Commonly known as:  LASIX Take 20 mg by mouth every evening.   hydrALAZINE 100 MG tablet Commonly known as:  APRESOLINE Take 100 mg by mouth 3 (three) times daily.   isosorbide mononitrate 30 MG 24 hr tablet Commonly known as:  IMDUR Take 30 mg by mouth daily.   polyethylene glycol 17 g packet Commonly known as:  MIRALAX / GLYCOLAX Take 17 g by mouth daily.   potassium chloride 10 MEQ tablet Commonly known as:  K-DUR Take 10 mEq by mouth daily.   simvastatin 40 MG tablet Commonly known as:  ZOCOR Take 40 mg by mouth at bedtime.   tamsulosin 0.4 MG Caps capsule Commonly known as:  FLOMAX Take 0.4 mg by mouth daily.         Management plans discussed with the patient and he is in agreement. Stable for discharge home  Patient should follow up with pcp  CODE STATUS:     Code  Status Orders  (From admission, onward)         Start     Ordered   09/22/18 1935  Limited resuscitation (code)  Continuous    Question Answer Comment  In the event of cardiac or respiratory ARREST: Initiate Code Blue, Call Rapid Response Yes   In the event of cardiac or respiratory ARREST: Perform CPR Yes   In the event of cardiac or respiratory ARREST: Perform Intubation/Mechanical Ventilation No   In the event of cardiac or respiratory ARREST: Use NIPPV/BiPAp only if indicated Yes   In the event of cardiac or respiratory ARREST: Administer ACLS medications if indicated Yes   In the event of cardiac or respiratory ARREST: Perform Defibrillation or Cardioversion if indicated Yes      09/22/18 1935        Code Status History    Date Active Date Inactive Code Status Order ID Comments User Context   11/07/2015 1150 11/09/2015 1747 DNR [id number]  Demetrios Loll, MD Inpatient   03/27/2015 1135 03/29/2015 1901 Full Code [id number]  Nicholes Mango, MD Inpatient   02/21/2015 1907 02/23/2015 1454 DNR [id number]  Vaughan Basta, MD Inpatient      TOTAL TIME TAKING CARE OF THIS PATIENT: 39 minutes.    Note: This dictation was prepared with Dragon dictation along with smaller phrase technology. Any transcriptional errors that result from this process are unintentional.  Bettey Costa M.D on 09/24/2018 at 11:05 AM  Between 7am to 6pm - Pager - [phone] After 6pm go to [email or web address] - password [email or web address] [place]  Office  [phone]  CC: Primary care physician; [facility] [place] [facility]

## 2018-09-24 NOTE — Progress Notes (Signed)
  Speech Language Pathology Treatment: Dysphagia  Patient Details Name: Phillip [name]. MRN: [id number] DOB: [birth year] Today's Date: 09/24/2018 Time: 7893-8101 SLP Time Calculation (min) (ACUTE ONLY): 29 min  Assessment / Plan / Recommendation Clinical Impression  Patient seen for ongoing assessment of swallowing, diet toleration, and trial upgraded texture.  Patient demonstrates no clinical indicators of aspiration with spoon boluses of honey-thick liquid.  Patient demonstrates immediate throat clearing and delayed cough with dysphagia 2 consistency.  Recommend remaining on the dysphagia 1 diet with honey-thick liquid.  Recommend home health speech therapy for ongoing assessment of diet needs.   HPI HPI: Pt is a 85 y.o. male with a known history of hypertension, history of CVA, Dysphagia, CAD who presented to the ED with shortness of breath and cough over the last couple of days.  He has bringing up some sputum.  He denies any chest pain.  He states he does not have any issues with swallowing or choking on food though he has a h/o Dysphagia - Family states that they think he has possibly aspirated, as he is on a pured diet at home but refuses to eat it.  CXR revealed: "Widespread patchy density in the right lung. Mild patchy density at the left base. Findings are consistent with bronchopneumonia and could certainly be secondary to aspiration, as suspected clinically",  In the ED, he was hypoxic to 88% on room air and was placed on 2 L O2.  He was tachypneic with respiratory rates in the mid to high 20s.       SLP Plan  Continue with current plan of care       Recommendations  Diet recommendations: Dysphagia 1 (puree);Honey-thick liquid Liquids provided via: Teaspoon Medication Administration: Whole meds with puree Supervision: Staff to assist with self feeding Compensations: Minimize environmental distractions;Slow rate;Small sips/bites Postural Changes and/or Swallow Maneuvers:  Seated upright 90 degrees;Upright 30-60 min after meal                Oral Care Recommendations: Oral care BID;Staff/trained caregiver to provide oral care Follow up Recommendations: Home health SLP SLP Visit Diagnosis: Dysphagia, oropharyngeal phase (R13.12) Plan: Continue with current plan of care       [place], [place], Daine Floras 09/24/2018, 11:35 AM

## 2018-09-25 LAB — URINE CULTURE: Culture: 80000 — AB

## 2018-09-27 LAB — CULTURE, BLOOD (ROUTINE X 2)
Culture: NO GROWTH
Culture: NO GROWTH
Special Requests: ADEQUATE
Special Requests: ADEQUATE

## 2018-10-04 ENCOUNTER — Telehealth: Payer: Self-pay | Admitting: Primary Care

## 2018-10-04 NOTE — Telephone Encounter (Signed)
Received call back from patient's daughter Phillip [name] and we have scheduled a Telephone Palliative Consult for 10/06/18 @ 11 AM with Ralene Bathe, NP.

## 2018-10-06 ENCOUNTER — Other Ambulatory Visit: Payer: Self-pay

## 2018-10-06 ENCOUNTER — Other Ambulatory Visit: Payer: Medicare Other | Admitting: Primary Care

## 2018-10-07 ENCOUNTER — Other Ambulatory Visit: Payer: Self-pay

## 2018-10-07 ENCOUNTER — Other Ambulatory Visit: Payer: Medicare Other | Admitting: Primary Care

## 2018-10-07 DIAGNOSIS — Z515 Encounter for palliative care: Secondary | ICD-10-CM

## 2018-10-07 NOTE — Progress Notes (Signed)
[facility] Palliative Care Consult Note Telephone: [phone]  Fax: [phone]  TELEHEALTH VISIT STATEMENT Due to the COVID-19 crisis, this visit was done via telemedicine from my office. It was initiated and consented to by this patient and/or family.  PATIENT NAME: Phillip [name]. DOB: October 25, 1926 MRN: [id number]  PRIMARY CARE PROVIDER:   Marygrace Drought, MD  REFERRING PROVIDER:  Care, [facility] Primary [place], [place] [address]  RESPONSIBLE PARTY:  Extended Emergency Contact Information Primary Emergency Contact: Heal,Daisy L Address: [address]          [place], [place] [address] [place] Phone: [phone] Relation: Spouse Secondary Emergency Contact: [place] Phone: [phone] Mobile Phone: [phone] Relation: Daughter   Palliative Care was asked to follow patient by consultation request of Dr.Heffington, Elta Guadeloupe, MD.  This is the initial visit.           ASSESSMENT and RECOMMENDATIONS:     1. Home Health: Need re referral to home health for SN for medication management, disease education, PT for eval and treat, and ST for eval and treat.  Call phone [phone] and fax [phone] for referral. [place] who had seen patient for admission to home health several weeks ago. Patient had been d/c from [facility] with home health but one family member didn't think they needed home health. Patient has significant home health needs. Patient is still weak and unable to ambulate. Frequent falls, PT assessment appropriate, ST for chronic aspiration of food and medications. Called PCP and left message with case manager RN to pls send re referral for services ASAP.  2. Nutrition: Needs ST eval, aspiration pneumonia due to mass in throat per daughter. Needs puree but refuses and then is back in the hospital with aspiration pneumonia. Also takes ensure. Weight 11/2017  = 179 lb, 09/2018 = 184 lbs.   3. Pain:  Not an issue currently.  4. Constipation: Has h/o constipation. Lately been more regular, taking miralax. Also on iron now but has had trouble crushing pills. Discussed a liquid preparation if this would be better tolerated. Anemia plan should be discussed after visit with PCP.  5. Mobility: Poor safety/fall risk. Recommend PT with home health. Walker and cane but does not use them. Fell twice in the home in last month.   6. Goals of Care: DNR, no" life support" e.g.ventilator wanted per discussion with daughter. DNR uploaded today in [facility].  Discussed goals of care with daughter.  I will discuss prognosis with PCP in order to determine appropriate time to introduce hospice.  Will speak again after MD visit next Tuesday and continue to discuss advance directives. Visit consisted of counseling and education dealing with the complex and emotionally intense issues of symptom management and palliative care in the setting of serious and potentially life-threatening illness.  Palliative care will continue to follow for goals of care clarification and symptom management. Return 1-2 weeks or prn.  I spent 60 minutes providing this consultation,  from 10 am to 11:00. More than 50% of the time in this consultation was spent coordinating communication.   HISTORY OF PRESENT ILLNESS:  Phillip [name]. is a 85 y.o. year old male with multiple medical problems including dysphagia, CVA, BPH, frequent falls, CAD. Palliative Care was asked to help address goals of care.   CODE STATUS: DNR  PPS: 40% HOSPICE ELIGIBILITY/DIAGNOSIS: TBD  PAST MEDICAL HISTORY:  Past Medical History:  Diagnosis Date   Benign enlargement of prostate    Coronary artery disease    Dysphagia    Hypertension    Kidney stone    Stroke [facility])     SOCIAL HX:  Social History   Tobacco Use   Smoking status: Former Smoker    Packs/day: 1.00    Years: 40.00    Pack years: 40.00     Types: Cigars   Smokeless tobacco: Never Used  Substance Use Topics   Alcohol use: No    ALLERGIES: No Known Allergies   PERTINENT MEDICATIONS:  Outpatient Encounter Medications as of 10/07/2018  Medication Sig   amoxicillin-clavulanate (AUGMENTIN) 875-125 MG tablet Take 1 tablet by mouth 2 (two) times daily.   aspirin 81 MG chewable tablet Chew 81 mg by mouth daily.   carvedilol (COREG) 6.25 MG tablet Take 6.25 mg by mouth 2 (two) times daily with a meal.   chlorthalidone (HYGROTON) 25 MG tablet Take 25 mg by mouth daily.   ferrous sulfate (FERROUSUL) 325 (65 FE) MG tablet Take 1 tablet (325 mg total) by mouth daily with breakfast.   finasteride (PROSCAR) 5 MG tablet Take 5 mg by mouth daily.    furosemide (LASIX) 20 MG tablet Take 20 mg by mouth every evening.   hydrALAZINE (APRESOLINE) 100 MG tablet Take 100 mg by mouth 3 (three) times daily.   isosorbide mononitrate (IMDUR) 30 MG 24 hr tablet Take 30 mg by mouth daily.   polyethylene glycol (MIRALAX / GLYCOLAX) 17 g packet Take 17 g by mouth daily.   potassium chloride (K-DUR) 10 MEQ tablet Take 10 mEq by mouth daily.   simvastatin (ZOCOR) 40 MG tablet Take 40 mg by mouth at bedtime.   tamsulosin (FLOMAX) 0.4 MG CAPS capsule Take 0.4 mg by mouth daily.    vitamin B-12 1000 MCG tablet Take 1 tablet (1,000 mcg total) by mouth daily.   No facility-administered encounter medications on file as of 10/07/2018.     PHYSICAL EXAM/ ROS  Weight 11/2017 = 179 lb, 09/2018 = 184 lbs.   General: NAD, frail, weight stable,  HOH, h/o dyphagia Cardiovascular:no chest pain noted, no edema noted Pulmonary: occ cough, no DOE Abdomen: good appetite, refuses puree and has frequent aspiration, bms regular Extremities: weakness, falls Skin: no rashes or wounds Neurological: Weakness , some forgetfulness,   Cyndia Skeeters DNP, AGPCNP-BC

## 2018-10-14 ENCOUNTER — Other Ambulatory Visit: Payer: Medicare Other | Admitting: Primary Care

## 2018-10-14 ENCOUNTER — Other Ambulatory Visit: Payer: Self-pay

## 2018-10-14 DIAGNOSIS — Z515 Encounter for palliative care: Secondary | ICD-10-CM

## 2018-10-14 NOTE — Progress Notes (Signed)
[facility] Consult Note Telephone: [phone]  Fax: [phone]   TELEHEALTH VISIT STATEMENT Due to the COVID-19 crisis, this visit was done via telemedicine from my office. It was initiated and consented to by this patient and/or family.  PATIENT NAME: Phillip [name]. DOB: 10-29-1926 MRN: [id number]  PRIMARY CARE PROVIDER:   Marygrace Drought, MD  REFERRING PROVIDER:  Marygrace Drought, MD [place], [place] [address]  RESPONSIBLE PARTY:   Extended Emergency Contact Information Primary Emergency Contact: Giannattasio,Daisy L Address: [address]          [place], [place] [address] [place] Phone: [phone] Relation: Spouse Secondary Emergency Contact: [place] Phone: [phone] Mobile Phone: [phone] Relation: Daughter  Palliative Care was asked to follow patient by consultation request of Dr. Marygrace Drought, MD  . This is a follow up visit.   ASSESSMENT and RECOMMENDATIONS:   1.Home health: Continues to need home health services, t/c to reactivate referral. PCP office to resend referral to [facility]. Need for PT, RN, HHA. Family was questioning ST need but I feel a home assessment would be best practice, to determine any safety measures they can institute. Home health RN can draw labs incidental to a skilled visit in the home.  2. Nutrition: Continues to eat well, coughs on eating, very likely aspirating some. We discussed the cycle of hospitalizations if aspiration infections continue. Iron and B12  Supplements on hold per PCP.  3. Safety: PT has not yet begun at home. Pt has walker, family feels he needs a w/c. Daughter denies falls but endorses a few "near misses" in the home.  4. Constipation: Has improved since oral iron was d/c'ed. Discussed bowel program as needed. Taking prune juice. Patient is reticent  to drink much water.Education provided RE need to ongoing bowel function in light of decreased oral intake.   5. Goals of Care: Revisit hospice after home health has maximized rehabilitative goals.  Discussed having home health begin services with interventions for any attainable goals. After home health meets goals, we will discuss hospice appropriateness, daughter in agreement. We discussed pt's upcoming 89th birthday, that this could be a goal for him to reach. Daughter states he is the oldest sibling, several have already passed away, and his parents were both centenarians.   Palliative care will continue to follow for goals of care clarification and symptom management. Return 2 weeks or prn.  I spent 25 minutes providing this consultation,  from 1045 to 1110. More than 50% of the time in this consultation was spent coordinating communication.   HISTORY OF PRESENT ILLNESS:  Phillip [name]. is a 85 y.o. year old male with multiple medical problems including  dysphagia, CVA, BPH, frequent falls, CAD,chronic aspiration. Palliative Care was asked to help address goals of care.   CODE STATUS: DNR  PPS: 30% HOSPICE ELIGIBILITY/DIAGNOSIS: TBD  PAST MEDICAL HISTORY:  Past Medical History:  Diagnosis Date  . Benign enlargement of prostate   . Coronary artery disease   . Dysphagia   . Hypertension   . Kidney stone   . Stroke [facility])     SOCIAL HX:  Social History   Tobacco Use  . Smoking status: Former Smoker    Packs/day: 1.00    Years: 40.00    Pack years: 40.00    Types: Cigars  . Smokeless tobacco: Never Used  Substance Use Topics  .  Alcohol use: No    ALLERGIES: No Known Allergies   PERTINENT MEDICATIONS:  Outpatient Encounter Medications as of 10/14/2018  Medication Sig  . amoxicillin-clavulanate (AUGMENTIN) 875-125 MG tablet Take 1 tablet by mouth 2 (two) times daily.  Marland Kitchen aspirin 81 MG chewable tablet Chew 81 mg by mouth daily.  . carvedilol (COREG) 6.25 MG tablet Take  6.25 mg by mouth 2 (two) times daily with a meal.  . chlorthalidone (HYGROTON) 25 MG tablet Take 25 mg by mouth daily.  . ferrous sulfate (FERROUSUL) 325 (65 FE) MG tablet Take 1 tablet (325 mg total) by mouth daily with breakfast.  . finasteride (PROSCAR) 5 MG tablet Take 5 mg by mouth daily.   . furosemide (LASIX) 20 MG tablet Take 20 mg by mouth every evening.  . hydrALAZINE (APRESOLINE) 100 MG tablet Take 100 mg by mouth 3 (three) times daily.  . isosorbide mononitrate (IMDUR) 30 MG 24 hr tablet Take 30 mg by mouth daily.  . polyethylene glycol (MIRALAX / GLYCOLAX) 17 g packet Take 17 g by mouth daily.  . potassium chloride (K-DUR) 10 MEQ tablet Take 10 mEq by mouth daily.  . simvastatin (ZOCOR) 40 MG tablet Take 40 mg by mouth at bedtime.  . tamsulosin (FLOMAX) 0.4 MG CAPS capsule Take 0.4 mg by mouth daily.   . vitamin B-12 1000 MCG tablet Take 1 tablet (1,000 mcg total) by mouth daily.   No facility-administered encounter medications on file as of 10/14/2018.     PHYSICAL EXAM:   General: NAD, frail appearing, thin Cardiovascular:d/c furosmide at pcp, use prn edema  Pulmonary: coughs during eating, known aspiration,  Abdomen: eating well, sitting up for aspiration precautions. bms regular GU:  Denies dysuria Extremities: ambulates with assistance Skin: no rashes, wounds reported Neurological: Weakness, deficits in memory and motor function d/t CVA  Cyndia Skeeters DNP, AGPCNP-BC

## 2018-10-19 ENCOUNTER — Telehealth: Payer: Self-pay | Admitting: Primary Care

## 2018-10-19 NOTE — Telephone Encounter (Signed)
T/c to [facility] to check on home health admission. There is an RN scheduled to admit today. Will f/u with patient in 1-2 weeks for palliative appt.

## 2018-11-15 ENCOUNTER — Telehealth: Payer: Self-pay | Admitting: Primary Care

## 2018-11-15 NOTE — Telephone Encounter (Signed)
T/c to set up palliative visit, no answer, message left.

## 2018-11-22 ENCOUNTER — Telehealth: Payer: Self-pay | Admitting: Primary Care

## 2018-11-22 NOTE — Telephone Encounter (Signed)
Contacted patient's daughter Phillip [name] to schedule a Palliative f/u visit, no answer.  Left message with reason for call and requested a call back, left my name and contact information.

## 2018-11-28 ENCOUNTER — Telehealth: Payer: Self-pay | Admitting: Primary Care

## 2018-11-28 NOTE — Telephone Encounter (Signed)
Daughter Phillip [name] had called and left message, I returned her call with no answer, left message with my name and contact information.   I had called left her a message on 7/7 regarding scheduling a f/u visit.

## 2018-12-07 ENCOUNTER — Telehealth: Payer: Self-pay | Admitting: Primary Care

## 2018-12-07 NOTE — Telephone Encounter (Signed)
Contacted daughter Katharine Look to schedule a Palliative f/u visit, no answer.  Left message with reason for call along with my name and contact number.

## 2018-12-13 ENCOUNTER — Telehealth: Payer: Self-pay | Admitting: Primary Care

## 2018-12-13 NOTE — Telephone Encounter (Signed)
Called home, no answer. Called daughter, no answer. Message left to f/u with palliative if desired, if no call back will d/c in a week or so, and invited them to use Palliative again at any time.

## 2018-12-20 ENCOUNTER — Telehealth: Payer: Self-pay | Admitting: Primary Care

## 2018-12-20 NOTE — Telephone Encounter (Signed)
Spoke with daughter Katharine Look and told her that I have been unable to talk with patient to schedule f/u visit for several weeks and daughter stated that she would speak with patient and other family members to see if they want to continue with Palliative services and she will call me back to let me know.

## 2018-12-26 ENCOUNTER — Telehealth: Payer: Self-pay | Admitting: Primary Care

## 2018-12-26 NOTE — Telephone Encounter (Signed)
Rec'd a call back from patient's daughter Katharine Look and I have scheduled a Telephone Palliative f/u visit for 12/28/18 @ 11 AM.

## 2018-12-28 ENCOUNTER — Other Ambulatory Visit: Payer: Self-pay

## 2018-12-28 ENCOUNTER — Other Ambulatory Visit: Payer: Medicare Other | Admitting: Primary Care

## 2018-12-28 NOTE — Progress Notes (Signed)
Patient was a telemedicine no show. No charge. Does not want to reschedule. Subjective:     Patient ID: Phillip Brim., male   DOB: Jan 23, 1927, 85 y.o.   MRN: [id number]  HPI   Review of Systems     Objective:   Physical Exam     Assessment:    no show    Plan:  No show

## 2019-01-13 ENCOUNTER — Telehealth: Payer: Self-pay | Admitting: Primary Care

## 2019-01-13 NOTE — Telephone Encounter (Signed)
Rec'd call back from daughter Katharine Look and we have rescheduled the Telephone f/u visit for 01/16/19 @ 1 PM.

## 2019-01-16 ENCOUNTER — Other Ambulatory Visit: Payer: Medicare Other | Admitting: Primary Care

## 2019-01-16 ENCOUNTER — Other Ambulatory Visit: Payer: Self-pay

## 2019-01-16 DIAGNOSIS — Z515 Encounter for palliative care: Secondary | ICD-10-CM

## 2019-01-16 NOTE — Progress Notes (Signed)
[facility] Consult Note Telephone: [phone]  Fax: [phone]  TELEHEALTH VISIT STATEMENT Due to the COVID-19 crisis, this visit was done via telemedicine from my office. It was initiated and consented to by this patient and/or family.  PATIENT NAME: Phillip [name]. DOB: 1927-03-05 MRN: [id number]  PRIMARY CARE PROVIDER:   Marygrace Drought, MD, [place] [address] [phone]  REFERRING PROVIDER:  Marygrace Drought, MD [address] [facility],  [place] [address] [phone]  RESPONSIBLE PARTY:   Extended Emergency Contact Information Primary Emergency Contact: Kleier,Daisy L Address: [address]          [place], [place] [address] [place] Phone: [phone] Relation: Spouse Secondary Emergency Contact: Stana [place] Phone: [phone] Mobile Phone: [phone] Relation: Daughter   ASSESSMENT AND RECOMMENDATIONS:   1. Advance Care Planning/Goals of Care: Goals include to maximize quality of life and symptom management.  2. Symptom Management:   Frequent Falls: Daughter reports frequent falls, so far no fractures. He won't use a cane or walker. Just finished PT and RN home health. We discussed his safety. Daughter states he sometimes goes outside but stays on the porch.  Nutrition: Eating well, eats usually a good breakfast. Currently still having some coughing with eating.He is not following the procedures for aspiration precautions, e.g. puree. He wants regular food.  Constipation: Occasionally a problem, eating spinach and prune juice for constipation.   3. Family /Caregiver/Community Supports: Lives with wife and daughter who comes in every day. No other outside care givers. Has just finished PT at home.  4. Cognitive / Functional decline: They are concerned about possible wandering. He is  walking and has not done this yet. He tries to go out in the day time but they are aware. They feel he needs 24 hour supervision. His wife is present at night.   5. Follow up Palliative Care Visit: Palliative care will continue to follow for goals of care clarification and symptom management. Return 4 weeks or prn.  I spent 40 minutes providing this consultation,  from 1300 to 1340. More than 50% of the time in this consultation was spent coordinating communication.   HISTORY OF PRESENT ILLNESS:  Phillip [name]. is a 85 y.o. year old male with multiple medical problems including dysphagia, CVA, BPH, frequent falls, CAD,chronic aspiration .Palliative Care was asked to follow this patient by consultation request of Marygrace Drought, MD to help address advance care planning and goals of care. This is a follow up visit.  CODE STATUS: DNR reviewed and POA would like a MOST. I will send to them in [place] Mail for her completion.   PPS: 30% HOSPICE ELIGIBILITY/DIAGNOSIS: TBD  PAST MEDICAL HISTORY:  Past Medical History:  Diagnosis Date  . Benign enlargement of prostate   . Coronary artery disease   . Dysphagia   . Hypertension   . Kidney stone   . Stroke [facility])     SOCIAL HX:  Social History   Tobacco Use  . Smoking status: Former Smoker    Packs/day: 1.00    Years: 40.00    Pack years: 40.00    Types: Cigars  . Smokeless tobacco: Never Used  Substance Use Topics  . Alcohol use: No    ALLERGIES: No Known Allergies   PERTINENT MEDICATIONS:  Outpatient Encounter Medications as of 01/16/2019  Medication Sig  . amoxicillin-clavulanate (AUGMENTIN) 875-125  MG tablet Take 1 tablet by mouth 2 (two) times daily.  Marland Kitchen aspirin 81 MG chewable tablet Chew 81 mg by mouth daily.  . carvedilol (COREG) 6.25 MG tablet Take 6.25 mg by mouth 2 (two) times daily with a meal.  . chlorthalidone (HYGROTON) 25 MG tablet Take 25 mg by mouth daily.  . ferrous sulfate (FERROUSUL) 325 (65 FE) MG tablet Take 1  tablet (325 mg total) by mouth daily with breakfast.  . finasteride (PROSCAR) 5 MG tablet Take 5 mg by mouth daily.   . furosemide (LASIX) 20 MG tablet Take 20 mg by mouth every evening.  . hydrALAZINE (APRESOLINE) 100 MG tablet Take 100 mg by mouth 3 (three) times daily.  . isosorbide mononitrate (IMDUR) 30 MG 24 hr tablet Take 30 mg by mouth daily.  . polyethylene glycol (MIRALAX / GLYCOLAX) 17 g packet Take 17 g by mouth daily.  . potassium chloride (K-DUR) 10 MEQ tablet Take 10 mEq by mouth daily.  . simvastatin (ZOCOR) 40 MG tablet Take 40 mg by mouth at bedtime.  . tamsulosin (FLOMAX) 0.4 MG CAPS capsule Take 0.4 mg by mouth daily.   . vitamin B-12 1000 MCG tablet Take 1 tablet (1,000 mcg total) by mouth daily.   No facility-administered encounter medications on file as of 01/16/2019.     PHYSICAL EXAM / ROS:   Current and past weights: not available General: NAD, frail,  Cardiovascular: no chest pain reported, no edema reported Pulmonary: no cough, no increased SOB Abdomen: appetite good, endorses constipation, continent of bowel GU: denies dysuria, continent of urine MSK:  no joint deformities, is  Ambulatory with a cane, finished PT,frequent falls, does not do home exercises Skin: no rashes or wounds reported Neurological: Weakness, daughter reports tremor and body shakes,intermittent  Kirk, AGPCNP-BC

## 2019-02-16 ENCOUNTER — Telehealth: Payer: Self-pay

## 2019-02-16 NOTE — Telephone Encounter (Signed)
Visit rescheduled for 03/03/2019 @ 11:30am

## 2019-02-23 ENCOUNTER — Telehealth: Payer: Self-pay | Admitting: Primary Care

## 2019-02-23 NOTE — Telephone Encounter (Signed)
T/c from daughter Katharine Look confirming palliative home visit for 03/03/2019 at 11:30 am. This is confirmed.

## 2019-03-03 ENCOUNTER — Other Ambulatory Visit: Payer: Self-pay

## 2019-03-03 ENCOUNTER — Other Ambulatory Visit: Payer: Medicare Other | Admitting: Primary Care

## 2019-03-03 DIAGNOSIS — Z515 Encounter for palliative care: Secondary | ICD-10-CM

## 2019-03-03 NOTE — Progress Notes (Signed)
[facility] Consult Note Telephone: [phone]  Fax: [phone]  PATIENT NAME: Phillip [name] [address] [place] [address] [phone] (home)  DOB: [birth year] MRN: [id number]  PRIMARY CARE PROVIDER:   Marygrace Drought, MD, [place] [address] [phone]  REFERRING PROVIDER:  Marygrace Drought, MD [address] [facility] [place],  [place] [address] [phone]  RESPONSIBLE PARTY:   Extended Emergency Contact Information Primary Emergency Contact: Scioli,Daisy L Address: [address]          [place], [place] [address] [place] Phone: [phone] Relation: Spouse Secondary Emergency Contact: Stana [place] Phone: [phone] Mobile Phone: [phone] Relation: Daughter   ASSESSMENT AND RECOMMENDATIONS:   1. Advance Care Planning/Goals of Care: Goals include to maximize quality of life and symptom management. Discussed goals of care and treatment options with wife and daughter. MOST done today with DNR, Limited Scope, use of antibiotics, use of IV, no feeding tube. Uploaded to [facility].  2. Symptom Management:   Constipation: Occ, uses prunes, discussed OTC laxative if needed.   Falls: Frequent although none reported in past few weeks. Has a walker but prefers to ambulate with out in house. Education provided RE gait belt, fall safety.  Mentation: FAST score 6e, is verbal and pleasant  but HOH. Participates some in interview.   Follow up: Needs new PCP as former PCP is retiring. I encouraged them to get a set of labs with a drive thru flu vaccine he has scheduled. I encouraged then to get established with the PCP who will take over from Dr. Guadelupe Sabin.  3. Family /Caregiver/Community Supports: Lives with elderly wife in own home. Daughter participates in care.   4. Cognitive / Functional decline:  Able to converse but has memory loss. Able to ambulate in home, but has had frequent falls.  Going out is an extreme effort.   5. Follow up Palliative Care Visit: Palliative care will continue to follow for goals of care clarification and symptom management. Return 6 weeks or prn.  I spent 60 minutes providing this consultation,  from 1130 to 1230. More than 50% of the time in this consultation was spent coordinating communication.   HISTORY OF PRESENT ILLNESS:  [name]. is a 85 y.o. year old male with multiple medical problems including dementia, frequent falls, aspiration, frequent URI. Palliative Care was asked to follow this patient by consultation request of Marygrace Drought, MD to help address advance care planning and goals of care. This is a follow up visit.  CODE STATUS: MOST done today with DNR, Limited Scope, use of antibiotics, use of IV, no feeding tube. Uploaded to [facility].  PPS: 40% HOSPICE ELIGIBILITY/DIAGNOSIS: TBD  PAST MEDICAL HISTORY:  Past Medical History:  Diagnosis Date  . Benign enlargement of prostate   . Coronary artery disease   . Dysphagia   . Hypertension   . Kidney stone   . Stroke [facility])     SOCIAL HX:  Social History   Tobacco Use  . Smoking status: Former Smoker    Packs/day: 1.00    Years: 40.00    Pack years: 40.00    Types: Cigars  . Smokeless tobacco: Never Used  Substance Use Topics  . Alcohol use: No    ALLERGIES: No Known Allergies   PERTINENT MEDICATIONS:  Outpatient Encounter Medications as of 03/03/2019  Medication Sig  . amoxicillin-clavulanate (AUGMENTIN)  875-125 MG tablet Take 1 tablet by mouth 2 (two) times daily.  Marland Kitchen aspirin 81 MG chewable tablet Chew 81 mg by mouth daily.  . carvedilol (COREG) 6.25 MG tablet Take 6.25 mg by mouth 2 (two) times daily with a meal.  . chlorthalidone (HYGROTON) 25 MG tablet Take 25 mg by mouth daily.  . ferrous sulfate (FERROUSUL) 325 (65 FE) MG tablet Take 1 tablet (325 mg total) by  mouth daily with breakfast.  . finasteride (PROSCAR) 5 MG tablet Take 5 mg by mouth daily.   . furosemide (LASIX) 20 MG tablet Take 20 mg by mouth every evening.  . hydrALAZINE (APRESOLINE) 100 MG tablet Take 100 mg by mouth 3 (three) times daily.  . isosorbide mononitrate (IMDUR) 30 MG 24 hr tablet Take 30 mg by mouth daily.  . polyethylene glycol (MIRALAX / GLYCOLAX) 17 g packet Take 17 g by mouth daily.  . potassium chloride (K-DUR) 10 MEQ tablet Take 10 mEq by mouth daily.  . simvastatin (ZOCOR) 40 MG tablet Take 40 mg by mouth at bedtime.  . tamsulosin (FLOMAX) 0.4 MG CAPS capsule Take 0.4 mg by mouth daily.   . vitamin B-12 1000 MCG tablet Take 1 tablet (1,000 mcg total) by mouth daily.   No facility-administered encounter medications on file as of 03/03/2019.     PHYSICAL EXAM / ROS:   Current and past weights: 6'2", thinks he weighs around 180s. He has lost since June. Family thinks he's down a size in pants.  General: NAD, frail appearing, thin Cardiovascular: no chest pain reported, slight L LE  edema,  Pulmonary: no cough, no increased SOB, no dyspnea, room air Abdomen: appetite fair to good, denies constipation, incontinent of bowel at times GU: denies dysuria, incontinent of urine at times, uses depends for back up MSK:  no joint deformities, ambulatory in home, should use cane but seldom does. Gait belt suggested.No falls reported recently. Skin: no rashes or wounds reported Neurological: Weakness, moderate cognitive disorder, FAST stage 6E   Cyndia Skeeters DNP AGPCNP-BC  COVID-19 PATIENT SCREENING TOOL  Person answering questions: _______Sandra____________ _____   1.  Is the patient or any family member in the home showing any signs or symptoms regarding respiratory infection?               Person with Symptom- __________NA_________________  a. Fever                                                                          Yes___ No___          ___________________   b. Shortness of breath                                                    Yes___ No___          ___________________ c. Cough/congestion                                       Yes___  No___         ___________________ d. Body aches/pains                                                         Yes___ No___        ____________________ e. Gastrointestinal symptoms (diarrhea, nausea)           Yes___ No___        ____________________  2. Within the past 14 days, has anyone living in the home had any contact with someone with or under investigation for COVID-19?    Yes___ No_x_   Person __________________

## 2019-03-09 ENCOUNTER — Encounter: Payer: Self-pay | Admitting: Emergency Medicine

## 2019-03-09 ENCOUNTER — Other Ambulatory Visit: Payer: Self-pay

## 2019-03-09 ENCOUNTER — Emergency Department
Admission: EM | Admit: 2019-03-09 | Discharge: 2019-03-09 | Disposition: A | Discharge status: Home / Self Care | Payer: Medicare Other | Attending: Emergency Medicine | Admitting: Emergency Medicine

## 2019-03-09 ENCOUNTER — Emergency Department: Payer: Medicare Other

## 2019-03-09 DIAGNOSIS — Z85038 Personal history of other malignant neoplasm of large intestine: Secondary | ICD-10-CM | POA: Diagnosis not present

## 2019-03-09 DIAGNOSIS — Z20828 Contact with and (suspected) exposure to other viral communicable diseases: Secondary | ICD-10-CM | POA: Diagnosis not present

## 2019-03-09 DIAGNOSIS — R55 Syncope and collapse: Secondary | ICD-10-CM | POA: Insufficient documentation

## 2019-03-09 DIAGNOSIS — Z8673 Personal history of transient ischemic attack (TIA), and cerebral infarction without residual deficits: Secondary | ICD-10-CM | POA: Insufficient documentation

## 2019-03-09 DIAGNOSIS — I5030 Unspecified diastolic (congestive) heart failure: Secondary | ICD-10-CM | POA: Insufficient documentation

## 2019-03-09 DIAGNOSIS — I110 Hypertensive heart disease with heart failure: Secondary | ICD-10-CM | POA: Insufficient documentation

## 2019-03-09 DIAGNOSIS — Z87891 Personal history of nicotine dependence: Secondary | ICD-10-CM | POA: Insufficient documentation

## 2019-03-09 HISTORY — DX: Malignant neoplasm of colon, unspecified: C18.9

## 2019-03-09 HISTORY — DX: Pneumonitis due to inhalation of food and vomit: J69.0

## 2019-03-09 LAB — BASIC METABOLIC PANEL
Anion gap: 12 (ref 5–15)
BUN: 21 mg/dL (ref 8–23)
CO2: 23 mmol/L (ref 22–32)
Calcium: 7.1 mg/dL — ABNORMAL LOW (ref 8.9–10.3)
Chloride: 103 mmol/L (ref 98–111)
Creatinine, Ser: 1.34 mg/dL — ABNORMAL HIGH (ref 0.61–1.24)
GFR calc Af Amer: 54 mL/min — ABNORMAL LOW (ref >60)
GFR calc non Af Amer: 46 mL/min — ABNORMAL LOW (ref >60)
Glucose, Bld: 150 mg/dL — ABNORMAL HIGH (ref 70–99)
Potassium: 2.8 mmol/L — ABNORMAL LOW (ref 3.5–5.1)
Sodium: 138 mmol/L (ref 135–145)

## 2019-03-09 LAB — CBC WITH DIFFERENTIAL/PLATELET
Abs Immature Granulocytes: 0.02 10*3/uL (ref 0.00–0.07)
Basophils Absolute: 0 10*3/uL (ref 0.0–0.1)
Basophils Relative: 0 %
Eosinophils Absolute: 0 10*3/uL (ref 0.0–0.5)
Eosinophils Relative: 1 %
HCT: 23.9 % — ABNORMAL LOW (ref 39.0–52.0)
Hemoglobin: 7.3 g/dL — ABNORMAL LOW (ref 13.0–17.0)
Immature Granulocytes: 0 %
Lymphocytes Relative: 6 %
Lymphs Abs: 0.3 10*3/uL — ABNORMAL LOW (ref 0.7–4.0)
MCH: 22.3 pg — ABNORMAL LOW (ref 26.0–34.0)
MCHC: 30.5 g/dL (ref 30.0–36.0)
MCV: 72.9 fL — ABNORMAL LOW (ref 80.0–100.0)
Monocytes Absolute: 0.4 10*3/uL (ref 0.1–1.0)
Monocytes Relative: 7 %
Neutro Abs: 4.2 10*3/uL (ref 1.7–7.7)
Neutrophils Relative %: 86 %
Platelets: 184 10*3/uL (ref 150–400)
RBC: 3.28 MIL/uL — ABNORMAL LOW (ref 4.22–5.81)
RDW: 24.1 % — ABNORMAL HIGH (ref 11.5–15.5)
WBC: 4.9 10*3/uL (ref 4.0–10.5)
nRBC: 0 % (ref 0.0–0.2)

## 2019-03-09 LAB — HEPATIC FUNCTION PANEL
ALT: 10 U/L (ref 0–44)
AST: 25 U/L (ref 15–41)
Albumin: 2.1 g/dL — ABNORMAL LOW (ref 3.5–5.0)
Alkaline Phosphatase: 47 U/L (ref 38–126)
Bilirubin, Direct: 0.3 mg/dL — ABNORMAL HIGH (ref 0.0–0.2)
Indirect Bilirubin: 0.6 mg/dL (ref 0.3–0.9)
Total Bilirubin: 0.9 mg/dL (ref 0.3–1.2)
Total Protein: 5.3 g/dL — ABNORMAL LOW (ref 6.5–8.1)

## 2019-03-09 LAB — TROPONIN I (HIGH SENSITIVITY): Troponin I (High Sensitivity): 10 ng/L (ref <18)

## 2019-03-09 LAB — BRAIN NATRIURETIC PEPTIDE: B Natriuretic Peptide: 59 pg/mL (ref 0.0–100.0)

## 2019-03-09 LAB — SARS CORONAVIRUS 2 (TAT 6-24 HRS): SARS Coronavirus 2: NEGATIVE

## 2019-03-09 MED ORDER — POTASSIUM CHLORIDE CRYS ER 20 MEQ PO TBCR
40.0000 meq | EXTENDED_RELEASE_TABLET | Freq: Once | ORAL | Status: AC
  Administered 2019-03-09: 14:00:00 40 meq via ORAL
  Filled 2019-03-09: qty 2

## 2019-03-09 MED ORDER — ONDANSETRON HCL 4 MG/2ML IJ SOLN
4.0000 mg | Freq: Once | INTRAMUSCULAR | Status: AC
  Administered 2019-03-09: 12:00:00 4 mg via INTRAVENOUS
  Filled 2019-03-09: qty 2

## 2019-03-09 NOTE — ED Notes (Signed)
Diaper dry on leaving. Daughter ok taking pt home.  Leaving in 2 gowns, pt does not have pants.

## 2019-03-09 NOTE — ED Triage Notes (Addendum)
Pt here for syncope. Was round unresponsive on toilet found by family. Per EMS lives with family but there were poor historian.  Unsure of pt baseline. Has been vomiting. Hx aspiration PNA.  Seen by palliative per notes in epic.  VSS with EMS 2 mg IM zofran by ems

## 2019-03-09 NOTE — ED Provider Notes (Signed)
[facility] Emergency Department Provider Note       Time seen: ----------------------------------------- 11:18 AM on 03/09/2019 -----------------------------------------   I have reviewed the triage vital signs and the nursing notes.  HISTORY   Chief Complaint Loss of Consciousness    HPI Phillip [name]. is a 85 y.o. male with a history of aspiration pneumonia, coronary artery disease, dysphagia, hypertension, kidney stones, colon cancer, CVA who presents to the ED for syncope.  Patient was found unresponsive on the toilet by his family.  He lives at home with the family who are poor historians.  He has been vomiting, has a history of aspiration pneumonia.  He was given Zofran in route by EMS.  Past Medical History:  Diagnosis Date  . Aspiration pneumonia ([place])   . Benign enlargement of prostate   . Coronary artery disease   . Dysphagia   . Hypertension   . Kidney stone   . Malignant neoplasm of colon ([place])   . Stroke [employer] [employer])     Patient Active Problem List   Diagnosis Date Noted  . Acute respiratory failure with hypoxia ([place]) 09/22/2018  . Urinary retention 01/26/2017  . AKI (acute kidney injury) ([place]) 01/19/2017  . Hypertensive encephalopathy 01/18/2017  . Malignant hypertension 01/18/2017  . Overlapping malignant neoplasm of colon ([place]) 01/18/2017  . Recurrent aspiration pneumonia ([place]) 01/17/2017  . Risk for falls 03/12/2016  . Colon cancer ([place]) 02/14/2016  . CVA (cerebral vascular accident) ([place]) 02/14/2016  . Diastolic heart failure ([place]) 02/14/2016  . Fall 02/14/2016  . Hypertensive emergency 02/14/2016  . Right eyelid laceration 02/14/2016  . Blood in stool   . Gastritis   . Neoplasm of digestive system   . Benign neoplasm of transverse colon   . Calculus of kidney 11/18/2015  . BP (high blood pressure) 11/18/2015  . Arteriosclerosis of coronary artery 11/18/2015  . Benign fibroma of prostate 11/18/2015  . Gastrointestinal  bleeding, lower 11/15/2015  . Anemia, iron deficiency 11/15/2015  . Lower gastrointestinal bleed   . Rectal bleeding   . GIB (gastrointestinal bleeding) 11/07/2015  . GI bleed 11/07/2015  . HCAP (healthcare-associated pneumonia) 03/27/2015  . Gastroenteritis 02/23/2015  . Nausea with vomiting 02/23/2015  . Pneumonia 02/21/2015  . Bleeding from the nose 01/24/2014  . Can't get food down 01/24/2014    Past Surgical History:  Procedure Laterality Date  . APPENDECTOMY    . CATARACT EXTRACTION    . COLONOSCOPY WITH PROPOFOL N/A 11/26/2015   Procedure: COLONOSCOPY WITH PROPOFOL;  Surgeon: [name], MD;  Location: [facility] ENDOSCOPY;  Service: Endoscopy;  Laterality: N/A;  . ESOPHAGOGASTRODUODENOSCOPY (EGD) WITH PROPOFOL N/A 11/26/2015   Procedure: ESOPHAGOGASTRODUODENOSCOPY (EGD) WITH PROPOFOL;  Surgeon: [name], MD;  Location: [facility] ENDOSCOPY;  Service: Endoscopy;  Laterality: N/A;    Allergies Patient has no known allergies.  Social History Social History   Tobacco Use  . Smoking status: Former Smoker    Packs/day: 1.00    Years: 40.00    Pack years: 40.00    Types: Cigars  . Smokeless tobacco: Never Used  Substance Use Topics  . Alcohol use: No  . Drug use: No    Review of Systems Constitutional: Negative for fever. Cardiovascular: Negative for chest pain. Respiratory: Positive for shortness of breath Gastrointestinal: Negative for abdominal pain, positive for vomiting Musculoskeletal: Negative for back pain. Skin: Negative for rash. Neurological: Negative for headaches, positive for weakness  All systems negative/normal/unremarkable except as stated in the HPI  ____________________________________________  PHYSICAL EXAM:  VITAL SIGNS: ED Triage Vitals [03/09/19 1113]  Enc Vitals Group     BP      Pulse      Resp      Temp      Temp src      SpO2      Weight 165 lb (74.8 kg)     Height      Head Circumference      Peak Flow      Pain Score       Pain Loc      Pain Edu?      Excl. in [place]?     Constitutional: Chronically ill-appearing, mild distress Eyes: Conjunctivae are normal. Normal extraocular movements. ENT      Head: Normocephalic and atraumatic.      Nose: No congestion/rhinnorhea.      Mouth/Throat: Mucous membranes are moist.      Neck: No stridor. Cardiovascular: Normal rate, regular rhythm. No murmurs, rubs, or gallops. Respiratory: Mild tachypnea with crackles bilaterally Gastrointestinal: Soft and nontender. Normal bowel sounds Musculoskeletal: Nontender with normal range of motion in extremities. No lower extremity tenderness nor edema. Neurologic:  Normal speech and language. No gross focal neurologic deficits are appreciated.  Skin:  Skin is warm, dry and intact. No rash noted. Psychiatric: Flat affect ____________________________________________  EKG: Interpreted by me.  Atrial fibrillation likely with a rate of 104 bpm, baseline artifact, LVH, normal QT  ____________________________________________  ED COURSE:  As part of my medical decision making, I reviewed the following data within the [place] History obtained from family if available, nursing notes, old chart and ekg, as well as notes from prior ED visits. Patient presented for syncope with vomiting and concerns for aspiration pneumonia, we will assess with labs and imaging as indicated at this time.   Procedures  Phillip [name]. was evaluated in Emergency Department on 03/09/2019 for the symptoms described in the history of present illness. He was evaluated in the context of the global COVID-19 pandemic, which necessitated consideration that the patient might be at risk for infection with the SARS-CoV-2 virus that causes COVID-19. Institutional protocols and algorithms that pertain to the evaluation of patients at risk for COVID-19 are in a state of rapid change based on information released by regulatory bodies including the  CDC and federal and state organizations. These policies and algorithms were followed during the patient's care in the ED.  ____________________________________________   LABS (pertinent positives/negatives)  Labs Reviewed  CBC WITH DIFFERENTIAL/PLATELET - Abnormal; Notable for the following components:      Result Value   RBC 3.28 (*)    Hemoglobin 7.3 (*)    HCT 23.9 (*)    MCV 72.9 (*)    MCH 22.3 (*)    RDW 24.1 (*)    Lymphs Abs 0.3 (*)    All other components within normal limits  BASIC METABOLIC PANEL - Abnormal; Notable for the following components:   Potassium 2.8 (*)    Glucose, Bld 150 (*)    Creatinine, Ser 1.34 (*)    Calcium 7.1 (*)    GFR calc non Af Amer 46 (*)    GFR calc Af Amer 54 (*)    All other components within normal limits  HEPATIC FUNCTION PANEL - Abnormal; Notable for the following components:   Total Protein 5.3 (*)    Albumin 2.1 (*)    Bilirubin, Direct 0.3 (*)    All other components within  normal limits  SARS CORONAVIRUS 2 (TAT 6-24 HRS)  BRAIN NATRIURETIC PEPTIDE  TROPONIN I (HIGH SENSITIVITY)    RADIOLOGY Images were viewed by me  Chest x-ray IMPRESSION:  No acute cardiopulmonary disease.  ____________________________________________   DIFFERENTIAL DIAGNOSIS   Dehydration, electrolyte abnormality, aspiration, vasovagal syncope, MI, PE  FINAL ASSESSMENT AND PLAN  Syncope   Plan: The patient had presented for syncope. Patient's labs did reveal mild hypokalemia and persistent anemia.  Labs are not significantly changed from prior.  Patient was given oral potassium here and is otherwise been observed without any complications.  Patient's imaging did not reveal an acute process.  Have discussed at length with the family who currently declined hospitalization.  Is unclear whether or not this was a vasovagal event or another etiology.  He is currently in palliative care.   Phillip Aly, MD    Note: This note was generated in  part or whole with voice recognition software. Voice recognition is usually quite accurate but there are transcription errors that can and very often do occur. I apologize for any typographical errors that were not detected and corrected.     Phillip Newport, MD 03/09/19 1328

## 2019-04-03 ENCOUNTER — Other Ambulatory Visit: Payer: Self-pay

## 2019-04-03 ENCOUNTER — Other Ambulatory Visit: Payer: Medicare Other | Admitting: Primary Care

## 2019-04-03 DIAGNOSIS — Z515 Encounter for palliative care: Secondary | ICD-10-CM

## 2019-04-03 NOTE — Progress Notes (Addendum)
[facility] Consult Note Telephone: [phone]  Fax: [phone]   PATIENT NAME: Phillip [name] [address] [place] [address] [phone] (home)  DOB: 10-25-1926 MRN: [id number]  PRIMARY CARE PROVIDER:   Marygrace Drought, MD, [place] [address] [phone]  REFERRING PROVIDER:  Marygrace Drought, MD [address] [facility] [place],  [place] [address] [phone]  RESPONSIBLE PARTY:   Extended Emergency Contact Information Primary Emergency Contact: [place],Daisy L Address: [address]          [place],  [address] [place] Phone: [phone] Relation: Spouse Secondary Emergency Contact: Stana Bunting [place] Phone: [phone] Mobile Phone: [phone] Relation: Daughter   ASSESSMENT AND RECOMMENDATIONS:   1. Advance Care Planning/Goals of Care: Goals include to maximize quality of life and symptom management.  Daughter called me this am to state father was much declined. She stated he had not eaten much and began to precipitously  decline on Saturday. Upon entering the home I found him sitting in a straight back chair at the bedside, he was breathing heavily and able to sit but had glassy eyes and was nonverbal. We were able to get him back into bed with the assistance of several family members and positioned him comfortably. Patient did not appear to be disconnect or in pain, his face was relaxed and he was breathing at approximately 20 breaths per minute. I discussed his imminence with the family, prognosticating likely hours to a day or two although I did acknowledge that some of the stages of dying can be prolonged. I offered hospice services multiple times explaining that it would be a service whereby a nurse would come in to support them, would be available on call, and would be able to do a death  pronouncement. Alternatively they could call 911 show them the do not resuscitate order and then they would be able to call the funeral once EMS signed off. Hospice was offered multiple times. Mrs. Kingham seemed somewhat confused about the services but in the end the family all decided they would not want the hospice benefit. I left them with instructions and a do not resuscitate completed form, and give them my number in case they needed it. If they change their mind about hospice I showed them the number to call.  2. Symptom Management: Does not exhibit pain or dyspnea at this time. Offer to refer to hospice for potential symptom management was declined by family.  3. Family /Caregiver/Community Supports: Patient is in his own home and bed, wife at bedside, and son who resides in the home. Wife's sister and a daughter also in the home.  4. Cognitive / Functional decline:  Not alert, unable to regard with eyes, swallow.  Completely dependent and unable to perform any purposeful task. Very near death.  5. Follow up Palliative Care Visit: Palliative care will continue to follow for goals of care clarification and symptom management. Gave number in case they needed it but also encouraged hospice admission this evening. Family declined.  I spent 40 minutes providing this consultation,  from 1530 to 1610. More than 50% of the time in this consultation was spent coordinating communication.   HISTORY OF PRESENT ILLNESS:  Phillip [name]. is a 85 y.o. year old male with multiple medical problems including debility, aspiration pneumonia, CAD. Palliative Care was asked to follow this patient by consultation request  of Marygrace Drought, MD to help address advance care planning and goals of care. This is a follow up visit.  CODE STATUS: DNR  PPS: 10% weak  HOSPICE ELIGIBILITY/DIAGNOSIS: yes but family declined  PAST MEDICAL HISTORY:  Past Medical History:  Diagnosis Date   Aspiration pneumonia  ([place])    Benign enlargement of prostate    Coronary artery disease    Dysphagia    Hypertension    Kidney stone    Malignant neoplasm of colon ([place])    Stroke ([place])     SOCIAL HX:  Social History   Tobacco Use   Smoking status: Former Smoker    Packs/day: 1.00    Years: 40.00    Pack years: 40.00    Types: Cigars   Smokeless tobacco: Never Used  Substance Use Topics   Alcohol use: No    ALLERGIES: No Known Allergies   PERTINENT MEDICATIONS:  Outpatient Encounter Medications as of 04/03/2019  Medication Sig   amoxicillin-clavulanate (AUGMENTIN) 875-125 MG tablet Take 1 tablet by mouth 2 (two) times daily.   aspirin 81 MG chewable tablet Chew 81 mg by mouth daily.   carvedilol (COREG) 6.25 MG tablet Take 6.25 mg by mouth 2 (two) times daily with a meal.   chlorthalidone (HYGROTON) 25 MG tablet Take 25 mg by mouth daily.   ferrous sulfate (FERROUSUL) 325 (65 FE) MG tablet Take 1 tablet (325 mg total) by mouth daily with breakfast. (Patient not taking: Reported on 03/03/2019)   finasteride (PROSCAR) 5 MG tablet Take 5 mg by mouth daily.    furosemide (LASIX) 20 MG tablet Take 20 mg by mouth every evening.   hydrALAZINE (APRESOLINE) 100 MG tablet Take 100 mg by mouth 3 (three) times daily.   isosorbide mononitrate (IMDUR) 30 MG 24 hr tablet Take 30 mg by mouth daily.   polyethylene glycol (MIRALAX / GLYCOLAX) 17 g packet Take 17 g by mouth daily.   potassium chloride (K-DUR) 10 MEQ tablet Take 10 mEq by mouth daily.   simvastatin (ZOCOR) 40 MG tablet Take 40 mg by mouth at bedtime.   tamsulosin (FLOMAX) 0.4 MG CAPS capsule Take 0.4 mg by mouth daily.    vitamin B-12 1000 MCG tablet Take 1 tablet (1,000 mcg total) by mouth daily. (Patient not taking: Reported on 03/03/2019)   No facility-administered encounter medications on file as of 04/03/2019.     PHYSICAL EXAM / ROS:   Current and past weights: unavailable  General: NAD, agonal, thin,  afebrile Cardiovascular: no chest pain reported, no edema, S1S2 Pulmonary: no cough, no increased SOB, lungs clear Abdomen: no intake,  endorses constipation, incontinent of bowel GU: denies dysuria, continent of urine MSK:  no joint deformities, non ambulatory, minimally responsive Skin: no rashes or wounds reported Neurological: Weakness, imminent, not able to respond, tract with eyes.  Jason Coop, NP  COVID-19 PATIENT SCREENING TOOL  Person answering questions: ______Mrs Thompson_____________ _____   1.  Is the patient or any family member in the home showing any signs or symptoms regarding respiratory infection?               Person with Symptom- ________na___________________  a. Fever  Yes___ No___          ___________________  b. Shortness of breath                                                    Yes___ No___          ___________________ c. Cough/congestion                                       Yes___  No___         ___________________ d. Body aches/pains                                                         Yes___ No___        ____________________ e. Gastrointestinal symptoms (diarrhea, nausea)           Yes___ No___        ____________________  2. Within the past 14 days, has anyone living in the home had any contact with someone with or under investigation for COVID-19?    Yes___ No_x_   Person __________________

## 2019-04-05 ENCOUNTER — Telehealth: Payer: Self-pay | Admitting: Primary Care

## 2019-04-05 NOTE — Telephone Encounter (Signed)
T/c from daughter Cortavious Agreda. Patient passed away at home on 2019-04-11 around 6 pm. Family was supporting one another and did not have any immediate concerns or needs.

## 2019-04-18 DEATH — deceased

## 2021-04-17 IMAGING — US US RENAL
1 series · 14 of 25 positions shown · non-contrast
Comparison: 3

CLINICAL DATA: Acute renal injury

EXAM:
RENAL / URINARY TRACT ULTRASOUND COMPLETE

[Series 1: us renal · 47 acquisitions, 14 frames shown]
[im 1/47]
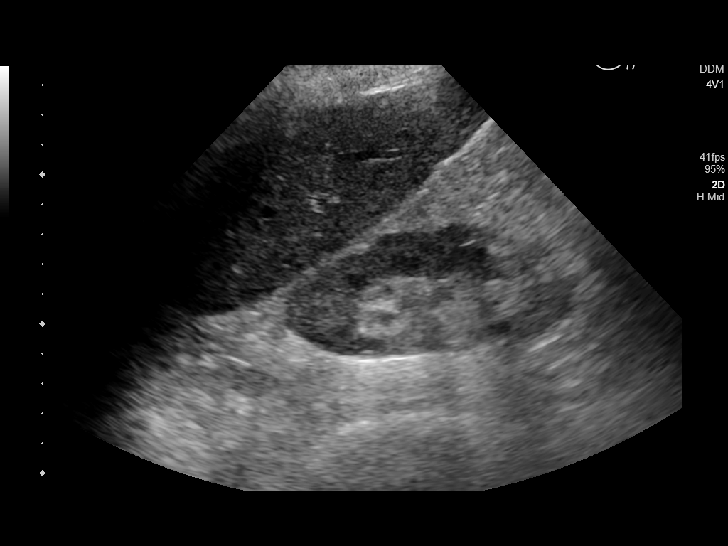
[im 4/47]
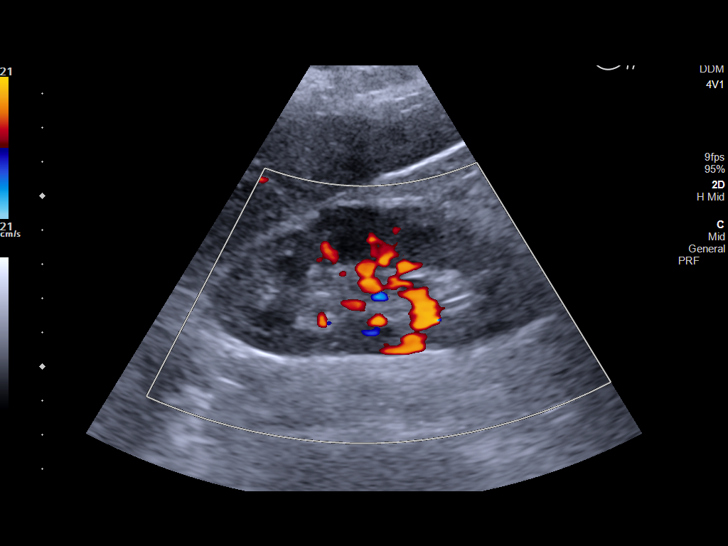
[im 8/47]
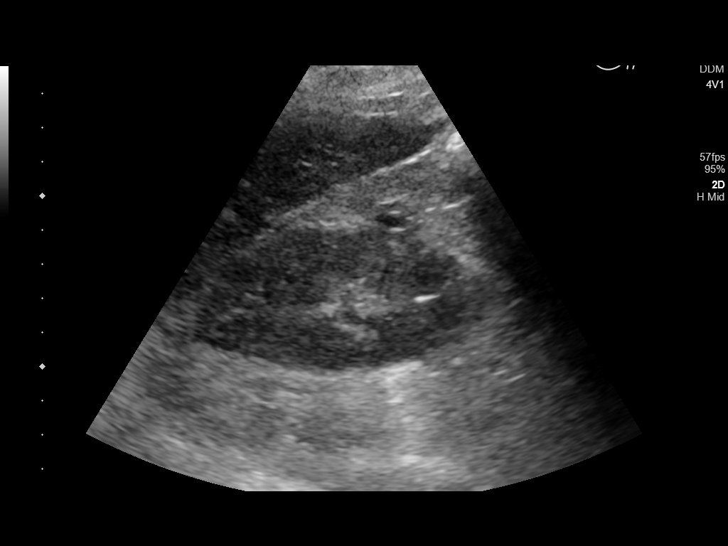
[im 12/47]
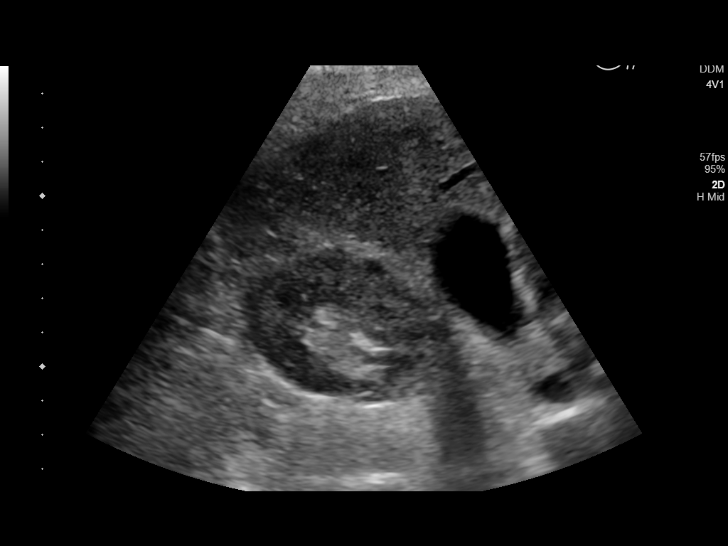
[im 16/47]
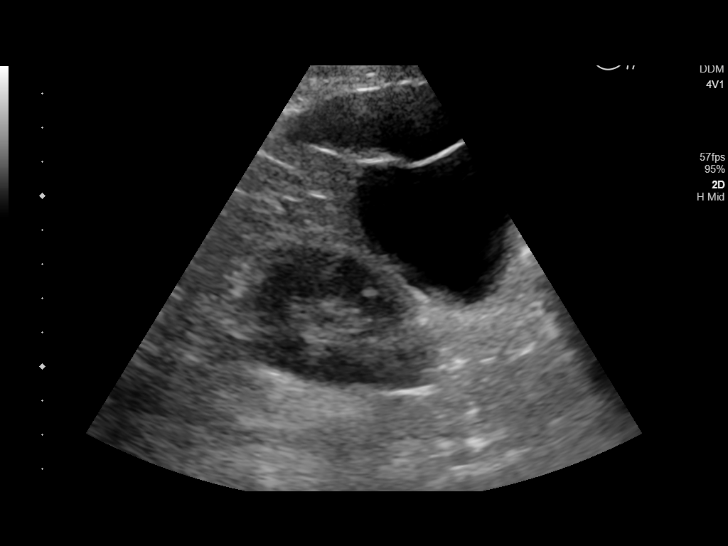
[im 18/47]
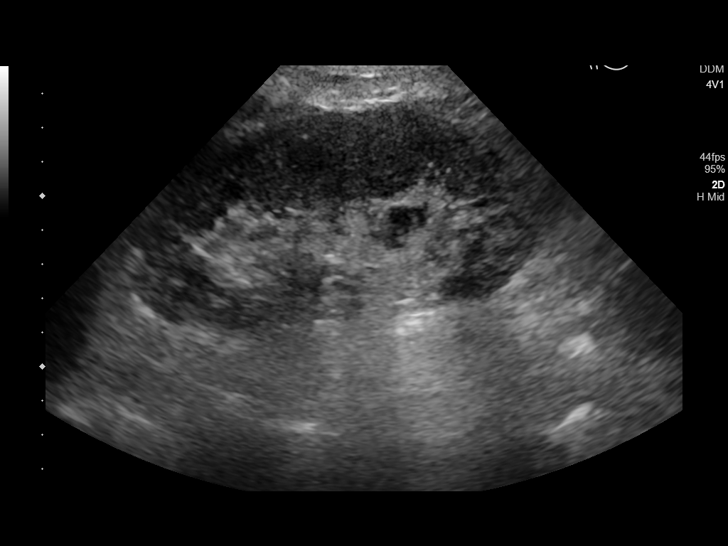
[im 22/47]
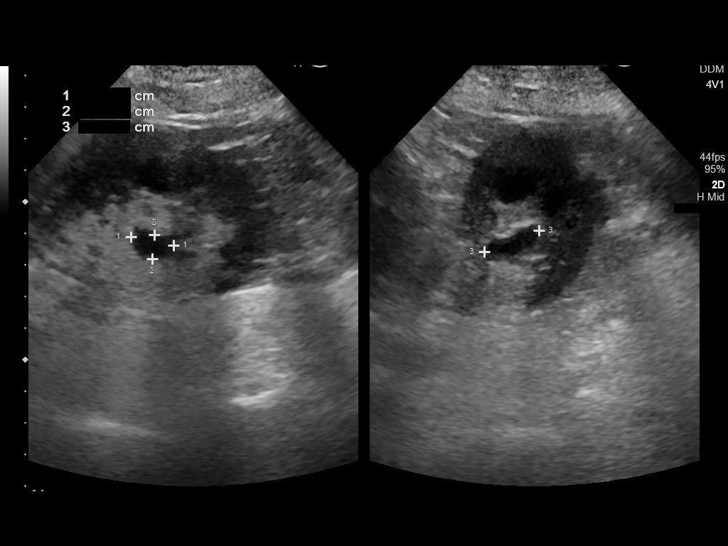
[im 25/47]
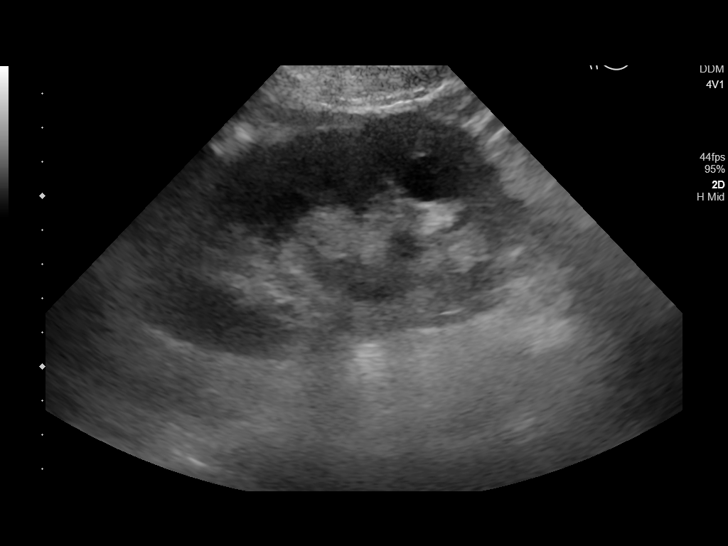
[im 29/47]
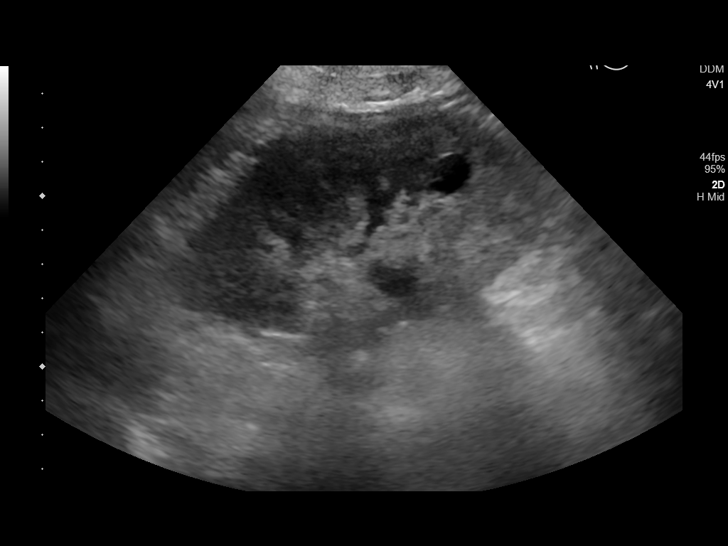
[im 31/47]
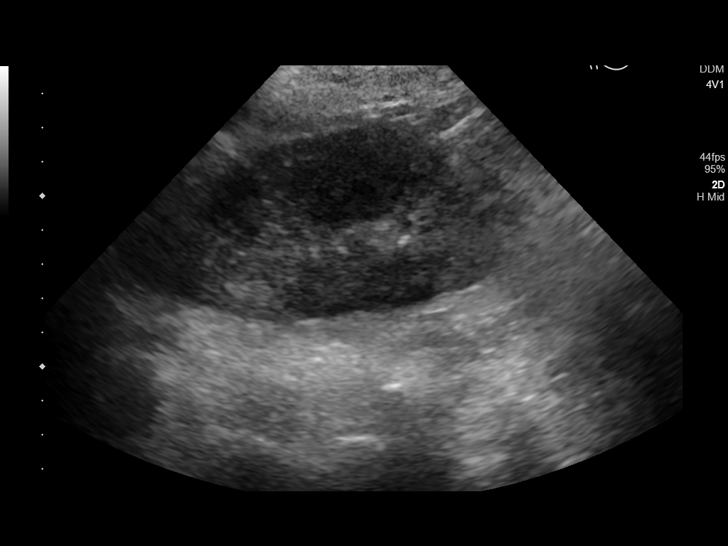
[im 35/47]
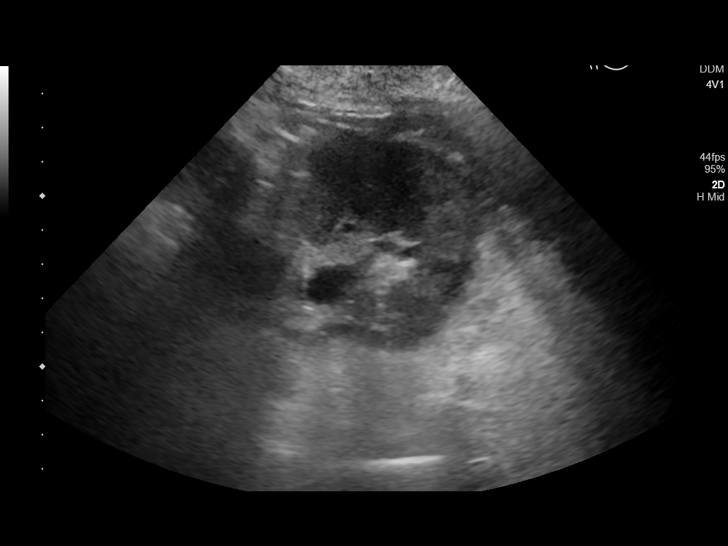
[im 39/47]
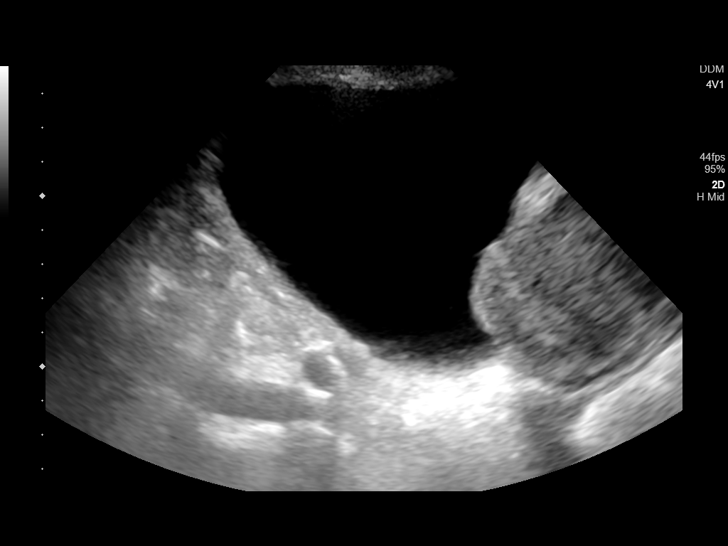
[im 43/47]
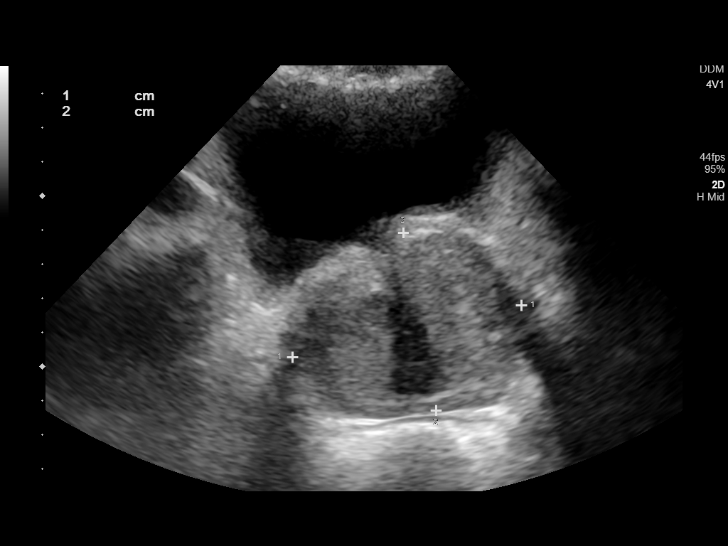
[im 47/47]
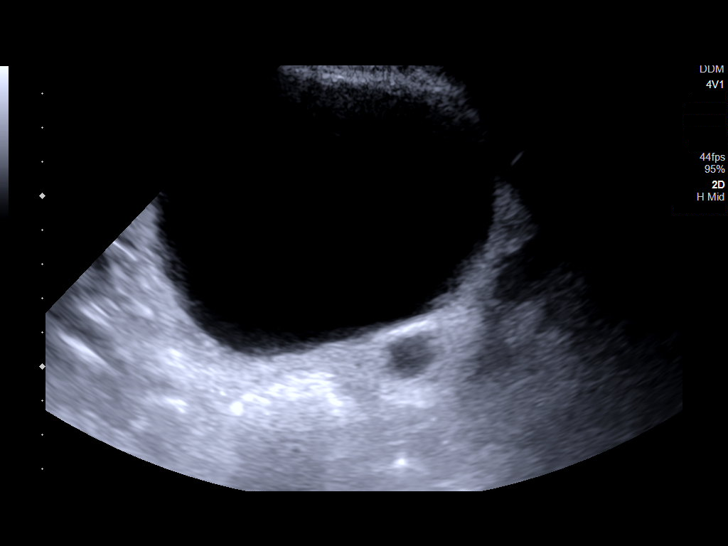

[14 of 25 positions shown; findings below may reference images not displayed]

FINDINGS: Right Kidney:

Renal measurements: 9.9 x 4.5 x 5.7 cm = volume: There is 132 mL .
Echogenicity within normal limits. No mass or hydronephrosis
visualized.

Left Kidney:

Renal measurements: 12.4 x 5.9 x 6.3 cm = volume: 244 mL. No
hydronephrosis. Two anechoic cysts measuring 1.5 cm.

Bladder:

Small bladder diverticulum. Mild bladder wall thickening. Enlarged
prostate gland equals 118 cubic cm.
IMPRESSION: 1. Normal kidneys. No hydronephrosis. Benign-appearing LEFT renal
cysts.
2. Small bladder diverticulum.  Large prostate gland.

## 2021-10-01 IMAGING — DX DG CHEST 1V PORT
1 series · 1 of 1 positions shown · non-contrast
Comparison: 09/22/2018

CLINICAL DATA: Dyspnea per ordering notes. Per ED notes- Pt here
for syncope. Was found unresponsive on toilet found by family. Per
EMS lives with family but there were poor historian. Unsure of pt
baseline. Has been vomiting. Hx aspiration PNA.

EXAM:
PORTABLE CHEST - 1 VIEW

[chest ap]
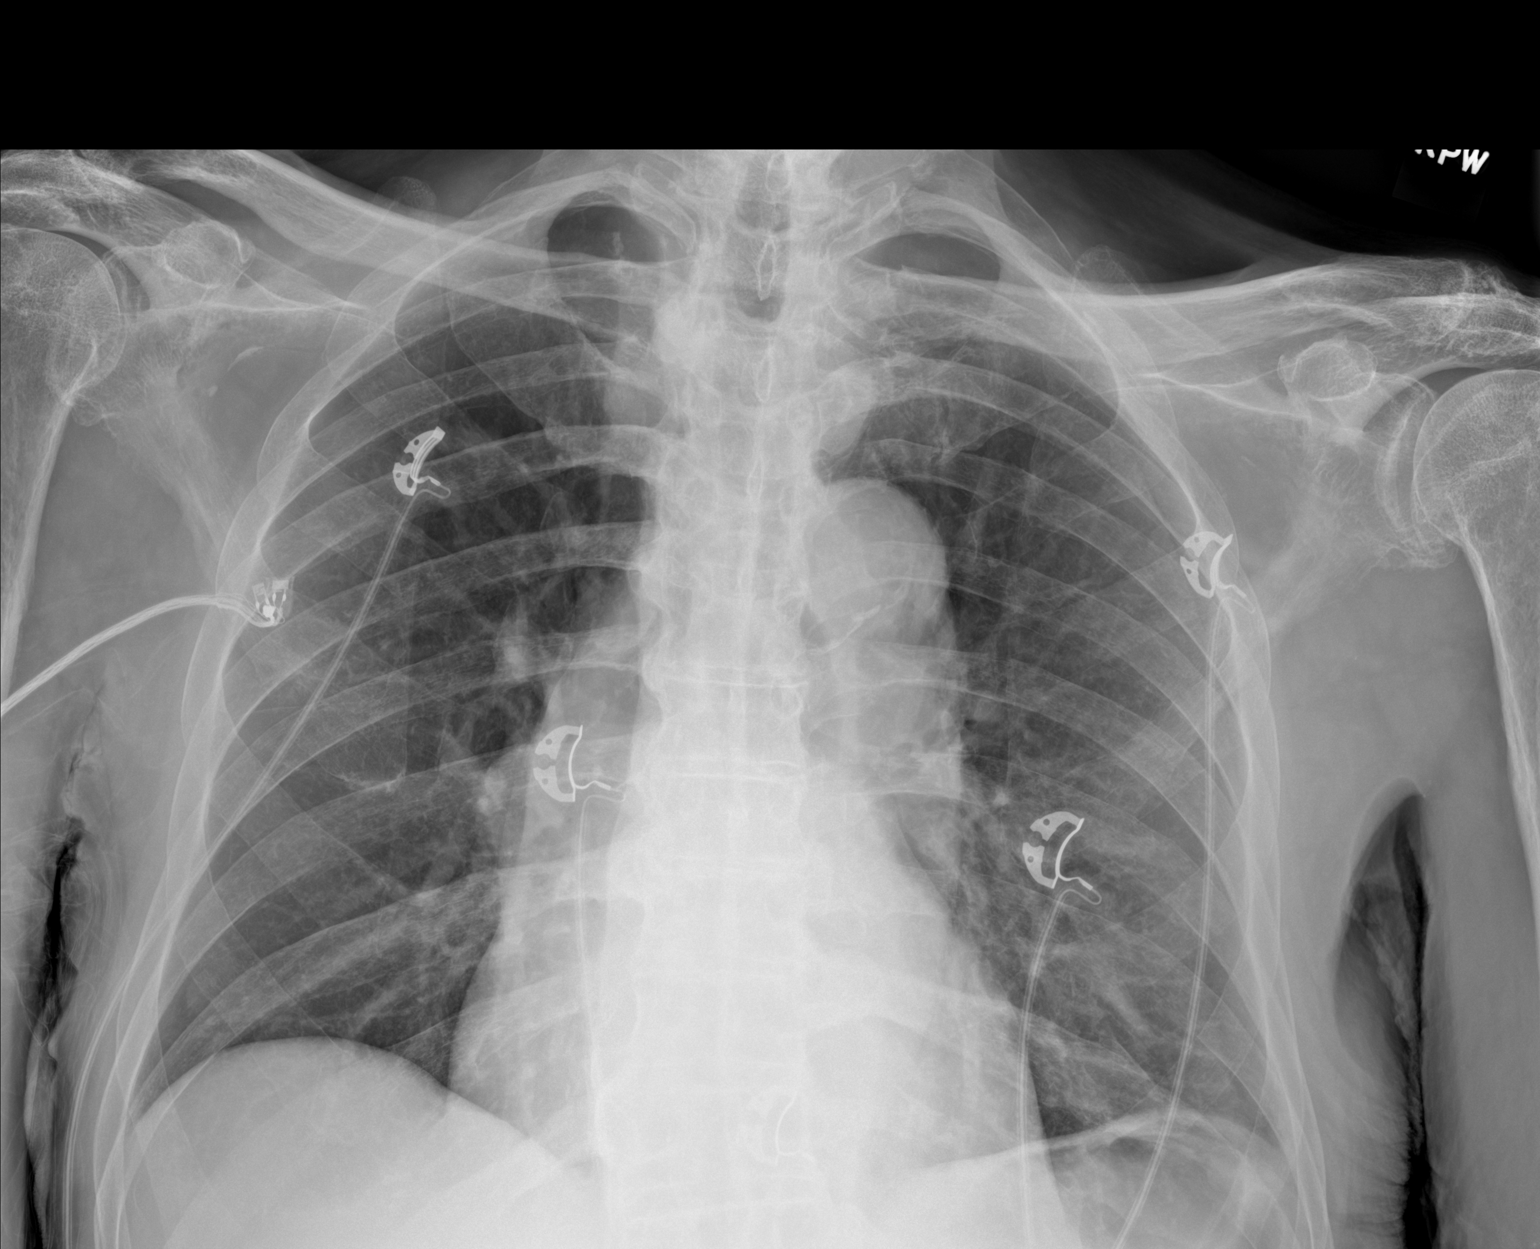

[1 of 1 positions shown; findings below may reference images not displayed]

FINDINGS: Interval resolution of right lung airspace opacities seen
previously. Lungs are clear. No edema.

Heart size and mediastinal contours are within normal limits. Aortic
Atherosclerosis ([id number]-[id number]).

No effusion. No pneumothorax.

DJD in bilateral shoulders.
IMPRESSION: No acute cardiopulmonary disease.
